# Patient Record
Sex: Female | Born: 1970
Health system: Southern US, Community
[De-identification: ages and names within clinical notes are randomized; demographics above are authoritative.]

## PROBLEM LIST (undated history)

## (undated) DIAGNOSIS — IMO0002 Reserved for concepts with insufficient information to code with codable children: Secondary | ICD-10-CM

## (undated) DIAGNOSIS — M069 Rheumatoid arthritis, unspecified: Secondary | ICD-10-CM

## (undated) DIAGNOSIS — S32019A Unspecified fracture of first lumbar vertebra, initial encounter for closed fracture: Secondary | ICD-10-CM

## (undated) DIAGNOSIS — M329 Systemic lupus erythematosus, unspecified: Secondary | ICD-10-CM

## (undated) DIAGNOSIS — M8580 Other specified disorders of bone density and structure, unspecified site: Secondary | ICD-10-CM

## (undated) DIAGNOSIS — R569 Unspecified convulsions: Secondary | ICD-10-CM

## (undated) HISTORY — DX: Other specified disorders of bone density and structure, unspecified site: M85.80

## (undated) HISTORY — DX: Unspecified convulsions: R56.9

## (undated) HISTORY — PX: SPINAL CORD STIMULATOR IMPLANT: SHX2422

## (undated) HISTORY — DX: Reserved for concepts with insufficient information to code with codable children: IMO0002

## (undated) HISTORY — DX: Systemic lupus erythematosus, unspecified: M32.9

## (undated) HISTORY — DX: Unspecified fracture of first lumbar vertebra, initial encounter for closed fracture: S32.019A

## (undated) HISTORY — DX: Rheumatoid arthritis, unspecified: M06.9

---

## 1998-06-29 ENCOUNTER — Ambulatory Visit (HOSPITAL_COMMUNITY): Admission: RE | Admit: 1998-06-29 | Discharge: 1998-06-29 | Payer: Self-pay | Admitting: Obstetrics and Gynecology

## 1998-10-04 ENCOUNTER — Other Ambulatory Visit: Admission: RE | Admit: 1998-10-04 | Discharge: 1998-10-04 | Payer: Self-pay | Admitting: Obstetrics and Gynecology

## 1998-12-13 ENCOUNTER — Inpatient Hospital Stay (HOSPITAL_COMMUNITY): Admission: EM | Admit: 1998-12-13 | Discharge: 1998-12-21 | Payer: Self-pay | Admitting: Neurosurgery

## 1998-12-13 ENCOUNTER — Encounter: Payer: Self-pay | Admitting: Emergency Medicine

## 1998-12-13 ENCOUNTER — Encounter: Payer: Self-pay | Admitting: Neurosurgery

## 1998-12-14 ENCOUNTER — Encounter: Payer: Self-pay | Admitting: Neurosurgery

## 1998-12-18 ENCOUNTER — Encounter: Payer: Self-pay | Admitting: Neurosurgery

## 1999-02-08 ENCOUNTER — Ambulatory Visit (HOSPITAL_COMMUNITY): Admission: RE | Admit: 1999-02-08 | Discharge: 1999-02-08 | Payer: Self-pay | Admitting: Neurosurgery

## 1999-02-08 ENCOUNTER — Encounter: Payer: Self-pay | Admitting: Neurosurgery

## 1999-04-10 ENCOUNTER — Encounter: Payer: Self-pay | Admitting: Neurosurgery

## 1999-04-10 ENCOUNTER — Ambulatory Visit (HOSPITAL_COMMUNITY): Admission: RE | Admit: 1999-04-10 | Discharge: 1999-04-10 | Payer: Self-pay | Admitting: Neurosurgery

## 1999-12-11 ENCOUNTER — Encounter: Payer: Self-pay | Admitting: Neurosurgery

## 1999-12-11 ENCOUNTER — Ambulatory Visit (HOSPITAL_COMMUNITY): Admission: RE | Admit: 1999-12-11 | Discharge: 1999-12-11 | Payer: Self-pay | Admitting: Neurosurgery

## 2000-12-09 ENCOUNTER — Other Ambulatory Visit: Admission: RE | Admit: 2000-12-09 | Discharge: 2000-12-09 | Payer: Self-pay | Admitting: Ophthalmology

## 2001-12-15 ENCOUNTER — Other Ambulatory Visit: Admission: RE | Admit: 2001-12-15 | Discharge: 2001-12-15 | Payer: Self-pay | Admitting: Obstetrics and Gynecology

## 2002-12-18 ENCOUNTER — Other Ambulatory Visit: Admission: RE | Admit: 2002-12-18 | Discharge: 2002-12-18 | Payer: Self-pay | Admitting: Obstetrics and Gynecology

## 2020-08-26 ENCOUNTER — Encounter: Payer: Self-pay | Admitting: Neurology

## 2020-12-02 ENCOUNTER — Ambulatory Visit: Payer: Self-pay | Admitting: Neurology

## 2021-01-09 ENCOUNTER — Encounter: Payer: Self-pay | Admitting: Neurology

## 2021-01-09 ENCOUNTER — Other Ambulatory Visit: Payer: Self-pay

## 2021-01-09 ENCOUNTER — Ambulatory Visit: Payer: Medicare HMO | Admitting: Neurology

## 2021-01-09 VITALS — BP 111/73 | HR 92 | Ht 68.5 in | Wt 139.2 lb

## 2021-01-09 DIAGNOSIS — R251 Tremor, unspecified: Secondary | ICD-10-CM | POA: Diagnosis not present

## 2021-01-09 DIAGNOSIS — G40909 Epilepsy, unspecified, not intractable, without status epilepticus: Secondary | ICD-10-CM

## 2021-01-09 DIAGNOSIS — R519 Headache, unspecified: Secondary | ICD-10-CM | POA: Diagnosis not present

## 2021-01-09 MED ORDER — CLONAZEPAM 0.5 MG PO TABS: ORAL_TABLET | ORAL | 5 refills | Status: DC

## 2021-01-09 MED ORDER — TOPIRAMATE 25 MG PO TABS: ORAL_TABLET | ORAL | 11 refills | Status: DC

## 2021-01-09 NOTE — Patient Instructions (Signed)
1. Schedule head CT without contrast  2. Schedule routine EEG  3. Refills sent for clonazepam 0.5mg  1 tab in AM, 2 tabs in PM and Topamax 25mg  1 tab in AM, 3 tabs in PM  4. Recommend finding a good therapist that you feel is a good fit for you  5. Follow-up in 6 months, call for any changes

## 2021-01-09 NOTE — Progress Notes (Signed)
NEUROLOGY CONSULTATION NOTE  Robin Paul MRN: [id number] DOB: [birth year]  Referring provider: Dr. Gildardo Cranker Primary care provider: Dr. Gildardo Cranker  Reason for consult:  Myoclonic seizures  Dear Dr Tenny Craw:  Thank you for your kind referral of Robin Paul for consultation of the above symptoms. Although her history is well known to you, please allow me to reiterate it for the purpose of our medical record. She is alone in the office today. Records and images were personally reviewed where available.   HISTORY OF PRESENT ILLNESS: This is a 50 year old left-handed woman with a history of Chiari malformation s/p posterior fossa decompression and VP shunt in 1991, history of myoclonic seizures s/p VNS/VNS removal in 2013, functional ataxia, chronic headaches, SCS placement, presenting to establish care. She has been seeing neurologist [name] in [place], [place] since 2015, records were reviewed. She was evaluated at [facility] for VP shunt malfunctioning and essentially felt to benefit from VP shunt removal (hypotensive headaches, VP shunt causing occipital neuralgia), with significant improvement of headaches with shunt removal. Headaches controlled on Topiramate 25/75 and prn Cambia. She has clonazepam as needed for muscle spasm/muscle pain. She takes 1-2 tablets of clonazepam to "keep the muscles at bay." She had her last refill in December and had been spreading it out and cutting in half, seeing a difference when she is unable to take the medication. She would go through a series of 4-5 spasms in a row, she can feel it gathering up in her chest, one arm, then the whole body. She has not had any falls due to these, the falls come from the right leg jerking that "no one has been able to figure out." She would stand and the left starts shaking. When she had her SCS done, her spine doctor told her it had nothing to do with her spine and wanted her to go to the [facility]. The clonazepam helps with  her right leg shaking as well. Last fall was a weeks ago, due to a combination of going up a step and right leg shaking. There is no loss of awareness/consciousness with the leg shaking. She reports a sensation like a goose egg in in the right frontal region. There are times when after getting up she would have pain localized at that spot. It was felt to be due to her neck or prior surgeries. Pain occurs every other week but comes on pretty strong, she has noticed when really bad she would have dried blood in her nostrils. She takes Aleve or Tylenol which may or may not help. She takes Dilaudid for her back but the head pain does not respond to this. She does not sleep well, with difficulties in sleep maintenance. She had been seeing a therapist in [facility] who also helped with her migraines. She reports myoclonic seizure where diagnosed in her mid to late 76s when they were occurring "all the time, up to 20 in a day." Her leg would jerk and she would kick the desk. At the same time, she was having headaches. She had an EEG and was diagnosed with myoclonic jerks at age 25. She was tried on different medications until she settled on Topamax in 1998 which has worked the best. She takes 25 in AM, 75mg  in PM and feels this dose is works best with her body metabolism. She went up to 200mg  in the apst which caused side effects, they were able to get the dose down when she had a  VNS, then VNS was taken out in 2013. She moved back to [place] at the end of June, her divorce was finalized last week. She lives alone.   Review of prior notes on Epic from her neurologist in 2015: "she was labeled with clonic seizures, I do not have her long-term EEG report available from a different facility (2008), but reportedly it was described as normal." She had an EEG in 2015 with occasional focal delta slowing over the left temporal region, additionally occasional bitemporal theta slowing during wake state. She was evaluated by a Movement Disorder  specialist in 2016 for tremors in both legs despite treatment with Baclofen, Gabapentin, Lyrica, Cymbalta, Topamax, Keppra, carbamazepine, valproic acid. It was noted the jerks occur along with vocal sounds. She has tremor in hands, legs and head occasionally, triggered by certain positions. 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 PT and exercise were recommended, as well as increase in clonazepam dose to 1 tab TID.  She brings a copy of her EMG/NCV of her legs done at [facility] in October 2019 showing bilateral, inactive, lumbosacral radiculopathies (L5-S1 on the right and S2 on the left). She had numbness in her legs, worse after sitting for prolonged periods. She had incontinence when the setting on her SCS were too high.   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 There is no history of febrile convulsions, CNS infections such as meningitis/encephalitis, significant traumatic brain injury, neurosurgical procedures, or family history of seizures.  Prior ASMs: Gabapentin, Lyrica, Cymbalta, Topamax, Keppra, carbamazepine, valproic acid.   PAST MEDICAL HISTORY: Past Medical History:  Diagnosis Date  . Arthritis, rheumatoid (HCC)   . L1 vertebral fracture (HCC)   . Lupus (HCC)   . Seizure (HCC)    1998    PAST SURGICAL HISTORY: Past Surgical History:  Procedure Laterality Date  . SPINAL CORD STIMULATOR IMPLANT      MEDICATIONS: No current outpatient medications on file prior to visit.   No current facility-administered medications on file prior to visit.    ALLERGIES: Not on File  FAMILY HISTORY: History reviewed. No pertinent family history.  SOCIAL HISTORY: Social History   Socioeconomic History  . Marital status: Divorced    Spouse name: Not on file  . Number of children: Not on file  . Years of education: Not on file  . Highest education level: Not on file  Occupational History  . Not on file  Tobacco  Use  . Smoking status: Never Smoker  . Smokeless tobacco: Never Used  Vaping Use  . Vaping Use: Never used  Substance and Sexual Activity  . Alcohol use: Never  . Drug use: Never  . Sexual activity: Not on file  Other Topics Concern  . Not on file  Social History Narrative   Left handed    Lives alone    Social Determinants of Health   Financial Resource Strain: Not on file  Food Insecurity: Not on file  Transportation Needs: Not on file  Physical Activity: Not on file  Stress: Not on file  Social Connections: Not on file  Intimate Partner Violence: Not on file     PHYSICAL EXAM: Vitals:   01/09/21 1255  BP: 111/73  Pulse: 92  SpO2: 97%   General: No acute distress Head:  Normocephalic/atraumatic Skin/Extremities: No rash, no edema Neurological Exam: Mental status: alert and awake. No dysarthria or aphasia, Fund of knowledge is appropriate.  Recent and remote memory are  intact.  Attention and concentration are normal.  Cranial nerves: CN I: not tested CN II: pupils equal, round and reactive to light, visual fields intact CN III, IV, VI:  full range of motion, no nystagmus, no ptosis CN V: facial sensation intact CN VII: upper and lower face symmetric CN VIII: hearing intact to conversation Bulk & Tone: normal, no fasciculations. Motor: 5/5 throughout with no pronator drift. Sensation: intact to light touch, cold, pin, vibration sense.  Deep Tendon Reflexes: +2 throughout Cerebellar: no incoordination on finger to nose testing Gait: she started having irregular right leg jerking when she lifted her leg and started walking around the room with continued jerking   IMPRESSION: This is a 50 year old left-handed woman with a history of Chiari malformation s/p posterior fossa decompression and VP shunt in 1991, history of myoclonic seizures s/p VNS/VNS removal in 2013, functional ataxia, chronic headaches, SCS placement, presenting to establish care. Extensive records  review done, including Movement Disorders note indicating psychogenic movement disorder. She is concerned about pain on the left side where she feels a "goose egg," head CT without contrast will be ordered. She will be scheduled for an EEG. She is satisfied with current medication doses, refills sent for clonazepam 0.5mg  1 tab in AM, 2 tabs in PM and Topamax 25mg  1 tab in AM, 3 tabs in PM. She became tearful in the office with life changes, discussed finding a therapist that would be a good fit for her. [place] driving laws indicate no driving after a seizure until 6 months seizure-free. Follow-up in 6 months, she knows to call for any changes.   Thank you for allowing me to participate in the care of this patient. Please do not hesitate to call for any questions or concerns.   , M.D.  CC: Dr. Patrcia Dolly

## 2021-01-16 ENCOUNTER — Other Ambulatory Visit: Payer: Self-pay

## 2021-01-16 ENCOUNTER — Ambulatory Visit: Payer: Medicare HMO | Admitting: Neurology

## 2021-01-16 DIAGNOSIS — G40909 Epilepsy, unspecified, not intractable, without status epilepticus: Secondary | ICD-10-CM | POA: Diagnosis not present

## 2021-01-16 DIAGNOSIS — R519 Headache, unspecified: Secondary | ICD-10-CM

## 2021-01-20 DIAGNOSIS — M5136 Other intervertebral disc degeneration, lumbar region: Secondary | ICD-10-CM | POA: Diagnosis not present

## 2021-01-20 DIAGNOSIS — Z6821 Body mass index (BMI) 21.0-21.9, adult: Secondary | ICD-10-CM | POA: Diagnosis not present

## 2021-01-20 DIAGNOSIS — M0609 Rheumatoid arthritis without rheumatoid factor, multiple sites: Secondary | ICD-10-CM | POA: Diagnosis not present

## 2021-01-20 DIAGNOSIS — R768 Other specified abnormal immunological findings in serum: Secondary | ICD-10-CM | POA: Diagnosis not present

## 2021-01-23 ENCOUNTER — Telehealth: Payer: Self-pay | Admitting: Neurology

## 2021-01-23 NOTE — Telephone Encounter (Signed)
Pt would like her results of the EEG  Please call

## 2021-01-25 ENCOUNTER — Other Ambulatory Visit: Payer: Medicare HMO

## 2021-01-27 NOTE — Telephone Encounter (Signed)
Pt called and informed that EEG was normal, no seizure activity on brain waves seen.

## 2021-01-27 NOTE — Telephone Encounter (Signed)
Pls let her know the EEG was normal, no seizure activity on brain waves seen. Thanks

## 2021-01-30 DIAGNOSIS — Z9689 Presence of other specified functional implants: Secondary | ICD-10-CM | POA: Diagnosis not present

## 2021-01-30 DIAGNOSIS — G894 Chronic pain syndrome: Secondary | ICD-10-CM | POA: Diagnosis not present

## 2021-01-30 DIAGNOSIS — M5416 Radiculopathy, lumbar region: Secondary | ICD-10-CM | POA: Diagnosis not present

## 2021-01-31 ENCOUNTER — Other Ambulatory Visit: Payer: Self-pay

## 2021-01-31 ENCOUNTER — Ambulatory Visit
Admission: RE | Admit: 2021-01-31 | Discharge: 2021-01-31 | Disposition: A | Discharge status: Home / Self Care | Payer: Medicare HMO | Source: Ambulatory Visit | Attending: Neurology | Admitting: Neurology

## 2021-01-31 DIAGNOSIS — G9389 Other specified disorders of brain: Secondary | ICD-10-CM | POA: Diagnosis not present

## 2021-01-31 DIAGNOSIS — R519 Headache, unspecified: Secondary | ICD-10-CM | POA: Diagnosis not present

## 2021-01-31 DIAGNOSIS — G40909 Epilepsy, unspecified, not intractable, without status epilepticus: Secondary | ICD-10-CM

## 2021-02-01 ENCOUNTER — Telehealth: Payer: Self-pay

## 2021-02-01 NOTE — Telephone Encounter (Signed)
Pt called and given CT results

## 2021-02-01 NOTE — Telephone Encounter (Signed)
-----   Message from Van Clines, MD sent at 02/01/2021  2:29 PM EDT ----- Pls let her know the head CT looked fine, no evidence of tumor, stroke, or bleed. Thanks

## 2021-02-03 NOTE — Procedures (Addendum)
ELECTROENCEPHALOGRAM REPORT  Date of Study: 01/16/2021  Patient's Name: Robin Paul MRN: [id number] Date of Birth: [birth year]  Referring Provider: Dr. Patrcia Dolly  Clinical History: This is a 50 year old woman with a diagnosis of myoclonic epilepsy, right leg shaking.  Medications: Topamax Clonazepam Prozac   Technical Summary: A multichannel digital 47-minute EEG recording measured by the international 10-20 system with electrodes applied with paste and impedances below 5000 ohms performed in our laboratory with EKG monitoring in an awake and asleep patient.  Hyperventilation was not performed. Photic stimulation was performed.  The digital EEG was referentially recorded, reformatted, and digitally filtered in a variety of bipolar and referential montages for optimal display.    Description: The patient is awake and asleep during the recording.  During maximal wakefulness, there is a symmetric, medium voltage 10 Hz posterior dominant rhythm that attenuates with eye opening.  The record is symmetric.  During drowsiness and sleep, there is an increase in theta slowing of the background.  Vertex waves and symmetric sleep spindles were seen.  Photic stimulation did not elicit any abnormalities. She has an episode that starts with slight pelvic thrusting followed by low amplitude high frequency shaking of the right leg that stops suddenly after 36 seconds, then she flexes and extends foot. No associated epileptiform correlate.  There were no epileptiform discharges or electrographic seizures seen.    EKG lead was unremarkable.  Impression: This 47-minute awake and asleep EEG is normal.  Episode of right leg shaking did not show EEG correlate.  Clinical Correlation: A normal EEG does not exclude a clinical diagnosis of epilepsy. Episode of right leg shaking was non-epileptic. If further clinical questions remain, prolonged EEG may be helpful.  Clinical correlation is advised.   Patrcia Dolly, M.D.

## 2021-02-09 ENCOUNTER — Other Ambulatory Visit: Payer: Self-pay

## 2021-02-09 DIAGNOSIS — M0609 Rheumatoid arthritis without rheumatoid factor, multiple sites: Secondary | ICD-10-CM | POA: Diagnosis not present

## 2021-02-13 ENCOUNTER — Encounter: Payer: Self-pay | Admitting: Neurology

## 2021-02-13 NOTE — Progress Notes (Signed)
Samara Deist Vanorman Key: [id number] - PA Case ID: [id number] Need help? Call us at [phone] Outcome Approvedon March 26 PA Case: [id number], Status: Approved, Coverage Starts on: 11/19/2020 12:00:00 AM, Coverage Ends on: 11/18/2021 12:00:00 AM. Questions? Contact [phone]. Drug Topiramate 25MG  tablets Form Electronic PA Form

## 2021-04-06 DIAGNOSIS — Z79899 Other long term (current) drug therapy: Secondary | ICD-10-CM | POA: Diagnosis not present

## 2021-04-06 DIAGNOSIS — Z111 Encounter for screening for respiratory tuberculosis: Secondary | ICD-10-CM | POA: Diagnosis not present

## 2021-04-06 DIAGNOSIS — M0609 Rheumatoid arthritis without rheumatoid factor, multiple sites: Secondary | ICD-10-CM | POA: Diagnosis not present

## 2021-04-06 DIAGNOSIS — R5383 Other fatigue: Secondary | ICD-10-CM | POA: Diagnosis not present

## 2021-04-26 DIAGNOSIS — Z6821 Body mass index (BMI) 21.0-21.9, adult: Secondary | ICD-10-CM | POA: Diagnosis not present

## 2021-04-26 DIAGNOSIS — M25512 Pain in left shoulder: Secondary | ICD-10-CM | POA: Diagnosis not present

## 2021-04-26 DIAGNOSIS — M0609 Rheumatoid arthritis without rheumatoid factor, multiple sites: Secondary | ICD-10-CM | POA: Diagnosis not present

## 2021-04-26 DIAGNOSIS — M5136 Other intervertebral disc degeneration, lumbar region: Secondary | ICD-10-CM | POA: Diagnosis not present

## 2021-04-26 DIAGNOSIS — R768 Other specified abnormal immunological findings in serum: Secondary | ICD-10-CM | POA: Diagnosis not present

## 2021-05-01 DIAGNOSIS — G894 Chronic pain syndrome: Secondary | ICD-10-CM | POA: Diagnosis not present

## 2021-05-01 DIAGNOSIS — G259 Extrapyramidal and movement disorder, unspecified: Secondary | ICD-10-CM | POA: Diagnosis not present

## 2021-05-01 DIAGNOSIS — Z9689 Presence of other specified functional implants: Secondary | ICD-10-CM | POA: Diagnosis not present

## 2021-05-01 DIAGNOSIS — M5416 Radiculopathy, lumbar region: Secondary | ICD-10-CM | POA: Diagnosis not present

## 2021-06-01 DIAGNOSIS — M3500 Sicca syndrome, unspecified: Secondary | ICD-10-CM | POA: Diagnosis not present

## 2021-06-01 DIAGNOSIS — G40909 Epilepsy, unspecified, not intractable, without status epilepticus: Secondary | ICD-10-CM | POA: Diagnosis not present

## 2021-06-01 DIAGNOSIS — E041 Nontoxic single thyroid nodule: Secondary | ICD-10-CM | POA: Diagnosis not present

## 2021-06-01 DIAGNOSIS — M329 Systemic lupus erythematosus, unspecified: Secondary | ICD-10-CM | POA: Diagnosis not present

## 2021-06-01 DIAGNOSIS — M0609 Rheumatoid arthritis without rheumatoid factor, multiple sites: Secondary | ICD-10-CM | POA: Diagnosis not present

## 2021-06-01 DIAGNOSIS — L732 Hidradenitis suppurativa: Secondary | ICD-10-CM | POA: Diagnosis not present

## 2021-06-01 DIAGNOSIS — Z0000 Encounter for general adult medical examination without abnormal findings: Secondary | ICD-10-CM | POA: Diagnosis not present

## 2021-06-01 DIAGNOSIS — Z136 Encounter for screening for cardiovascular disorders: Secondary | ICD-10-CM | POA: Diagnosis not present

## 2021-06-01 DIAGNOSIS — M069 Rheumatoid arthritis, unspecified: Secondary | ICD-10-CM | POA: Diagnosis not present

## 2021-07-26 ENCOUNTER — Ambulatory Visit: Payer: Medicare HMO | Admitting: Neurology

## 2021-07-26 ENCOUNTER — Other Ambulatory Visit: Payer: Self-pay

## 2021-07-26 ENCOUNTER — Encounter: Payer: Self-pay | Admitting: Neurology

## 2021-07-26 VITALS — BP 107/66 | HR 63 | Ht 69.0 in | Wt 145.8 lb

## 2021-07-26 DIAGNOSIS — R251 Tremor, unspecified: Secondary | ICD-10-CM | POA: Diagnosis not present

## 2021-07-26 DIAGNOSIS — R519 Headache, unspecified: Secondary | ICD-10-CM

## 2021-07-26 DIAGNOSIS — G40909 Epilepsy, unspecified, not intractable, without status epilepticus: Secondary | ICD-10-CM

## 2021-07-26 MED ORDER — CLONAZEPAM 0.5 MG PO TABS: ORAL_TABLET | ORAL | 5 refills | Status: DC

## 2021-07-26 MED ORDER — TOPIRAMATE 25 MG PO TABS: ORAL_TABLET | ORAL | 3 refills | Status: DC

## 2021-07-26 NOTE — Patient Instructions (Signed)
Good to see you! Refills have been sent for your medications. Follow-up in 6-8 months, call for any changes. 

## 2021-07-26 NOTE — Progress Notes (Signed)
NEUROLOGY FOLLOW UP OFFICE NOTE  Robin Paul [id number] 1970-12-24  HISTORY OF PRESENT ILLNESS: I had the pleasure of seeing Robin Paul in follow-up in the neurology clinic on 07/26/2021.  The patient was last seen 6 months ago for seizures. She has a history of Chiari malformation s/p posterior decompression and VP shunt, myoclonic seizures s/p VNS/VNS removal in 2013, functional ataxia, and chronic headaches. She has been evaluated by a Movement Disorders specialist in the past with a diagnosis of psychogenic movement disorder. On her initial visit, she reported headaches were controlled on Topiramate 25/75mg  and prn Cambia. She was on clonazepam 0.5mg  1 tab in AM, 2 tabs in PM for muscle spasms/jerks. She continued to report right leg shaking/jerking. EEG done 12/2020 was normal, episode of right leg jerking did not show any EEG correlate. She had a head CT without contrast in 01/2021 which did not show any acute changes, suboccipital craniectomy with mild underlying cerebellar encephalomalacia.   Since her last visit, symptoms overall stable. She has backed off a little on the clonazepam because she could not concentrate when taking it with the hydromorphone. She would take clonazepam 1/2 tab in AM, 1 tab in PM, but during times where she has more jerking and headaches that were consistent, she would go back to taking 1 tab in AM, 2 tabs in PM for a week until symptoms settle down. She continues on Topiramate  1 tab in AM, 3 tabs in PM. In the past,  dose was too much. She manages her medications depending on what her body tells her. She continues to deal with a lot of back pain and sees Neurosurgery for spinal cord stimulator adjustment. She had 2 falls since her last visit, both going down steps, last fall was in May, it took a couple of months to heal. She lives alone. Her stepmother passed away in May 29, 2023 and her father is in the hospital. Her mother lives close by.   History on Initial  Assessment 01/09/2021: This is a 50 year old left-handed woman with a history of Chiari malformation s/p posterior fossa decompression and VP shunt in 1991, history of myoclonic seizures s/p VNS/VNS removal in 2013, functional ataxia, chronic headaches, SCS placement, presenting to establish care. She has been seeing neurologist [name] in [place], [place] since 2015, records were reviewed. She was evaluated at [facility] for VP shunt malfunctioning and essentially felt to benefit from VP shunt removal (hypotensive headaches, VP shunt causing occipital neuralgia), with significant improvement of headaches with shunt removal. Headaches controlled on Topiramate 25/75 and prn Cambia. She has clonazepam as needed for muscle spasm/muscle pain. She takes 1-2 tablets of clonazepam to "keep the muscles at bay." She had her last refill in December and had been spreading it out and cutting in half, seeing a difference when she is unable to take the medication. She would go through a series of 4-5 spasms in a row, she can feel it gathering up in her chest, one arm, then the whole body. She has not had any falls due to these, the falls come from the right leg jerking that "no one has been able to figure out." She would stand and the left starts shaking. When she had her SCS done, her spine doctor told her it had nothing to do with her spine and wanted her to go to the [facility]. The clonazepam helps with her right leg shaking as well. Last fall was a weeks ago, due to a combination of  going up a step and right leg shaking. There is no loss of awareness/consciousness with the leg shaking. She reports a sensation like a goose egg in in the right frontal region. There are times when after getting up she would have pain localized at that spot. It was felt to be due to her neck or prior surgeries. Pain occurs every other week but comes on pretty strong, she has noticed when really bad she would have dried blood in her nostrils. She  takes Aleve or Tylenol which may or may not help. She takes Dilaudid for her back but the head pain does not respond to this. She does not sleep well, with difficulties in sleep maintenance. She had been seeing a therapist in [facility] who also helped with her migraines. She reports myoclonic seizure where diagnosed in her mid to late 31s when they were occurring "all the time, up to 20 in a day." Her leg would jerk and she would kick the desk. At the same time, she was having headaches. She had an EEG and was diagnosed with myoclonic jerks at age 50. She was tried on different medications until she settled on Topamax in 1998 which has worked the best. She takes 25 in AM,  in PM and feels this dose is works best with her body metabolism. She went up to  in the apst which caused side effects, they were able to get the dose down when she had a VNS, then VNS was taken out in 2013. She moved back to [place] at the end of June, her divorce was finalized last week. She lives alone.   Review of prior notes on Epic from her neurologist in 2015: "she was labeled with clonic seizures, I do not have her long-term EEG report available from a different facility (2008), but reportedly it was described as normal." She had an EEG in 2015 with occasional focal delta slowing over the left temporal region, additionally occasional bitemporal theta slowing during wake state. She was evaluated by a Movement Disorder specialist in 2016 for tremors in both legs despite treatment with Baclofen, Gabapentin, Lyrica, Cymbalta, Topamax, Keppra, carbamazepine, valproic acid. It was noted the jerks occur along with vocal sounds. She has tremor in hands, legs and head occasionally, triggered by certain positions. 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 PT and exercise were recommended, as well as increase in clonazepam dose to 1 tab TID.  She brings a copy of her EMG/NCV of her legs done at [facility] in October 2019 showing  bilateral, inactive, lumbosacral radiculopathies (L5-S1 on the right and S2 on the left). She had numbness in her legs, worse after sitting for prolonged periods. She had incontinence when the setting on her SCS were too high.   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 There is no history of febrile convulsions, CNS infections such as meningitis/encephalitis, significant traumatic brain injury, neurosurgical procedures, or family history of seizures.  Prior ASMs: Gabapentin, Lyrica, Cymbalta, Topamax, Keppra, carbamazepine, valproic acid.   PAST MEDICAL HISTORY: Past Medical History:  Diagnosis Date   Arthritis, rheumatoid (HCC)    L1 vertebral fracture (HCC)    Lupus (HCC)    Seizure (HCC)    1998    MEDICATIONS: Current Outpatient Medications on File Prior to Visit  Medication Sig Dispense Refill   ascorbic acid (VITAMIN C) 1000 MG tablet      b complex vitamins capsule Take by mouth.  Cholecalciferol 125 MCG (5000 UT) TABS 10,000 UT daily     clonazePAM (KLONOPIN) 0.5 MG tablet Take 1 tab in the morning 2 tabs at night 90 tablet 5   Collagen-Boron-Hyaluronic Acid (MOVE FREE ULTRA JOINT HEALTH) 40-5-3.3 MG TABS See admin instructions.     FLUoxetine (PROZAC) 20 MG capsule 1 capsule     folic acid (FOLVITE) 1 MG tablet Take 3 mg by mouth. 3 mg daily     golimumab (SIMPONI ARIA) 50 MG/4ML SOLN injection Strength: 50 mg/4 mL; Form: solution; SIG: take 0 intravenously every 8 weeks     HYDROmorphone HCl (EXALGO) 8 MG TB24 take 1 tablet by oral route  every day at the same time each day swallowing whole. Do not break, crush, dissolve and/or chew. DNF 01/08/21     magnesium gluconate (MAGONATE) 500 MG tablet Take by mouth.     methocarbamol (ROBAXIN) 750 MG tablet Take by mouth.     Methotrexate, PF, 30 MG/0.6ML SOAJ Inject into the skin.     methylPREDNISolone (MEDROL DOSEPAK) 4 MG TBPK tablet See admin instructions. PRN for flair ups      Multiple Vitamins-Minerals (THERA-M) TABS Take 1 tablet by mouth daily.     topiramate (TOPAMAX) 25 MG tablet Take 1 tablet in the morning, and 3 tablets at night at bedtime 120 tablet 11   vitamin E 180 MG (400 UNITS) capsule 1 capsule     No current facility-administered medications on file prior to visit.    ALLERGIES: Allergies  Allergen Reactions   Codeine Other (See Comments) and Itching   Erythromycin Nausea And Vomiting and Nausea Only   Hydrocodone    Fentanyl Rash    FAMILY HISTORY: No family history on file.  SOCIAL HISTORY: Social History   Socioeconomic History   Marital status: Divorced    Spouse name: Not on file   Number of children: Not on file   Years of education: Not on file   Highest education level: Not on file  Occupational History   Not on file  Tobacco Use   Smoking status: Never   Smokeless tobacco: Never  Vaping Use   Vaping Use: Never used  Substance and Sexual Activity   Alcohol use: Never   Drug use: Never   Sexual activity: Not on file  Other Topics Concern   Not on file  Social History Narrative   ** Merged History Encounter **       Left handed  Lives alone    Social Determinants of Health   Financial Resource Strain: Not on file  Food Insecurity: Not on file  Transportation Needs: Not on file  Physical Activity: Not on file  Stress: Not on file  Social Connections: Not on file  Intimate Partner Violence: Not on file     PHYSICAL EXAM: Vitals:   07/26/21 1544  BP: 107/66  Pulse: 63  SpO2: 99%   General: No acute distress Head:  Normocephalic/atraumatic Skin/Extremities: No rash, no edema Neurological Exam: alert and awake. No aphasia or dysarthria. Fund of knowledge is appropriate.  Recent and remote memory are intact.  Attention and concentration are normal.   Cranial nerves: Pupils equal, round. Extraocular movements intact with no nystagmus. Visual fields full.  No facial asymmetry.  Motor: Bulk and tone  normal, muscle strength 5/5 throughout with no pronator drift.   Finger to nose testing intact.  Gait slow and cautious, no ataxia. No leg jerking today.   IMPRESSION: This is a 50 yo  LH woman with a history of Chiari malformation s/p posterior fossa decompression and VP shunt in 1991, history of myoclonic seizures s/p VNS/VNS removal in 2013, functional ataxia, chronic headaches, chronic back pain s/p SCS placement. We discussed tests done since her last visit, head CT no acute changes, EEG normal with right leg jerking showing normal EEG, indicating this is not epileptic. She was seen in the past by a Movement Disorders specialist and diagnosed with psychogenic movement disorder. Functional jerks and tremors occur in patients with chronic pain syndromes such as back pain. She is satisfied with current regimen of Topiramate 25mg  in AM 75mg  in PM for headache prophylaxis, clonazepam 0.5mg  1 tab in AM, 2 tabs in PM for body jerks, refills sent. Follow-up in 6-8 months, call for any changes.    Thank you for allowing me to participate in her care.  Please do not hesitate to call for any questions or concerns.    , M.D.   CC: Dr. 

## 2021-07-27 DIAGNOSIS — M0609 Rheumatoid arthritis without rheumatoid factor, multiple sites: Secondary | ICD-10-CM | POA: Diagnosis not present

## 2021-08-02 DIAGNOSIS — M5416 Radiculopathy, lumbar region: Secondary | ICD-10-CM | POA: Diagnosis not present

## 2021-08-02 DIAGNOSIS — G894 Chronic pain syndrome: Secondary | ICD-10-CM | POA: Diagnosis not present

## 2021-08-02 DIAGNOSIS — Z9689 Presence of other specified functional implants: Secondary | ICD-10-CM | POA: Diagnosis not present

## 2021-09-11 DIAGNOSIS — W19XXXA Unspecified fall, initial encounter: Secondary | ICD-10-CM | POA: Diagnosis not present

## 2021-09-11 DIAGNOSIS — S59902A Unspecified injury of left elbow, initial encounter: Secondary | ICD-10-CM | POA: Diagnosis not present

## 2021-09-11 DIAGNOSIS — S6992XA Unspecified injury of left wrist, hand and finger(s), initial encounter: Secondary | ICD-10-CM | POA: Diagnosis not present

## 2021-09-11 DIAGNOSIS — S52125A Nondisplaced fracture of head of left radius, initial encounter for closed fracture: Secondary | ICD-10-CM | POA: Diagnosis not present

## 2021-09-11 DIAGNOSIS — S4992XA Unspecified injury of left shoulder and upper arm, initial encounter: Secondary | ICD-10-CM | POA: Diagnosis not present

## 2021-09-11 DIAGNOSIS — S6991XA Unspecified injury of right wrist, hand and finger(s), initial encounter: Secondary | ICD-10-CM | POA: Diagnosis not present

## 2021-09-12 DIAGNOSIS — M25622 Stiffness of left elbow, not elsewhere classified: Secondary | ICD-10-CM | POA: Diagnosis not present

## 2021-09-12 DIAGNOSIS — M25532 Pain in left wrist: Secondary | ICD-10-CM | POA: Diagnosis not present

## 2021-09-12 DIAGNOSIS — S52124A Nondisplaced fracture of head of right radius, initial encounter for closed fracture: Secondary | ICD-10-CM | POA: Diagnosis not present

## 2021-09-21 DIAGNOSIS — M0609 Rheumatoid arthritis without rheumatoid factor, multiple sites: Secondary | ICD-10-CM | POA: Diagnosis not present

## 2021-10-03 DIAGNOSIS — S52135D Nondisplaced fracture of neck of left radius, subsequent encounter for closed fracture with routine healing: Secondary | ICD-10-CM | POA: Diagnosis not present

## 2021-10-17 DIAGNOSIS — M25622 Stiffness of left elbow, not elsewhere classified: Secondary | ICD-10-CM | POA: Diagnosis not present

## 2021-10-31 DIAGNOSIS — S52135D Nondisplaced fracture of neck of left radius, subsequent encounter for closed fracture with routine healing: Secondary | ICD-10-CM | POA: Diagnosis not present

## 2021-11-01 DIAGNOSIS — G894 Chronic pain syndrome: Secondary | ICD-10-CM | POA: Diagnosis not present

## 2021-11-01 DIAGNOSIS — Z9689 Presence of other specified functional implants: Secondary | ICD-10-CM | POA: Diagnosis not present

## 2021-11-01 DIAGNOSIS — F1120 Opioid dependence, uncomplicated: Secondary | ICD-10-CM | POA: Diagnosis not present

## 2021-11-01 DIAGNOSIS — M5416 Radiculopathy, lumbar region: Secondary | ICD-10-CM | POA: Diagnosis not present

## 2021-11-15 DIAGNOSIS — R059 Cough, unspecified: Secondary | ICD-10-CM | POA: Diagnosis not present

## 2021-11-15 DIAGNOSIS — R051 Acute cough: Secondary | ICD-10-CM | POA: Diagnosis not present

## 2021-11-15 DIAGNOSIS — R197 Diarrhea, unspecified: Secondary | ICD-10-CM | POA: Diagnosis not present

## 2021-11-15 DIAGNOSIS — U071 COVID-19: Secondary | ICD-10-CM | POA: Diagnosis not present

## 2021-11-15 DIAGNOSIS — Z03818 Encounter for observation for suspected exposure to other biological agents ruled out: Secondary | ICD-10-CM | POA: Diagnosis not present

## 2021-11-23 DIAGNOSIS — M0609 Rheumatoid arthritis without rheumatoid factor, multiple sites: Secondary | ICD-10-CM | POA: Diagnosis not present

## 2021-12-01 DIAGNOSIS — S52135D Nondisplaced fracture of neck of left radius, subsequent encounter for closed fracture with routine healing: Secondary | ICD-10-CM | POA: Diagnosis not present

## 2021-12-14 ENCOUNTER — Other Ambulatory Visit: Payer: Self-pay | Admitting: Family Medicine

## 2021-12-14 DIAGNOSIS — E041 Nontoxic single thyroid nodule: Secondary | ICD-10-CM | POA: Diagnosis not present

## 2021-12-14 DIAGNOSIS — M25552 Pain in left hip: Secondary | ICD-10-CM | POA: Diagnosis not present

## 2021-12-14 DIAGNOSIS — S42409A Unspecified fracture of lower end of unspecified humerus, initial encounter for closed fracture: Secondary | ICD-10-CM | POA: Diagnosis not present

## 2021-12-14 DIAGNOSIS — Z780 Asymptomatic menopausal state: Secondary | ICD-10-CM | POA: Diagnosis not present

## 2021-12-20 ENCOUNTER — Other Ambulatory Visit: Payer: Self-pay | Admitting: Family Medicine

## 2021-12-20 DIAGNOSIS — M8588 Other specified disorders of bone density and structure, other site: Secondary | ICD-10-CM

## 2021-12-25 ENCOUNTER — Other Ambulatory Visit: Payer: Self-pay | Admitting: Family Medicine

## 2021-12-25 ENCOUNTER — Ambulatory Visit
Admission: RE | Admit: 2021-12-25 | Discharge: 2021-12-25 | Disposition: A | Discharge status: Home / Self Care | Payer: Medicare HMO | Source: Ambulatory Visit | Attending: Family Medicine | Admitting: Family Medicine

## 2021-12-25 DIAGNOSIS — M25552 Pain in left hip: Secondary | ICD-10-CM

## 2021-12-25 DIAGNOSIS — E041 Nontoxic single thyroid nodule: Secondary | ICD-10-CM | POA: Diagnosis not present

## 2021-12-25 IMAGING — CR DG HIP (WITH OR WITHOUT PELVIS) 2-3V*L*
2 series · 2 of 2 positions shown · non-contrast
Comparison: None.

CLINICAL DATA: Patient fell 4 months ago with persistent left hip
pain.

EXAM:
DG HIP (WITH OR WITHOUT PELVIS) 2-3V LEFT

[w pelvis * (1 of 2)]
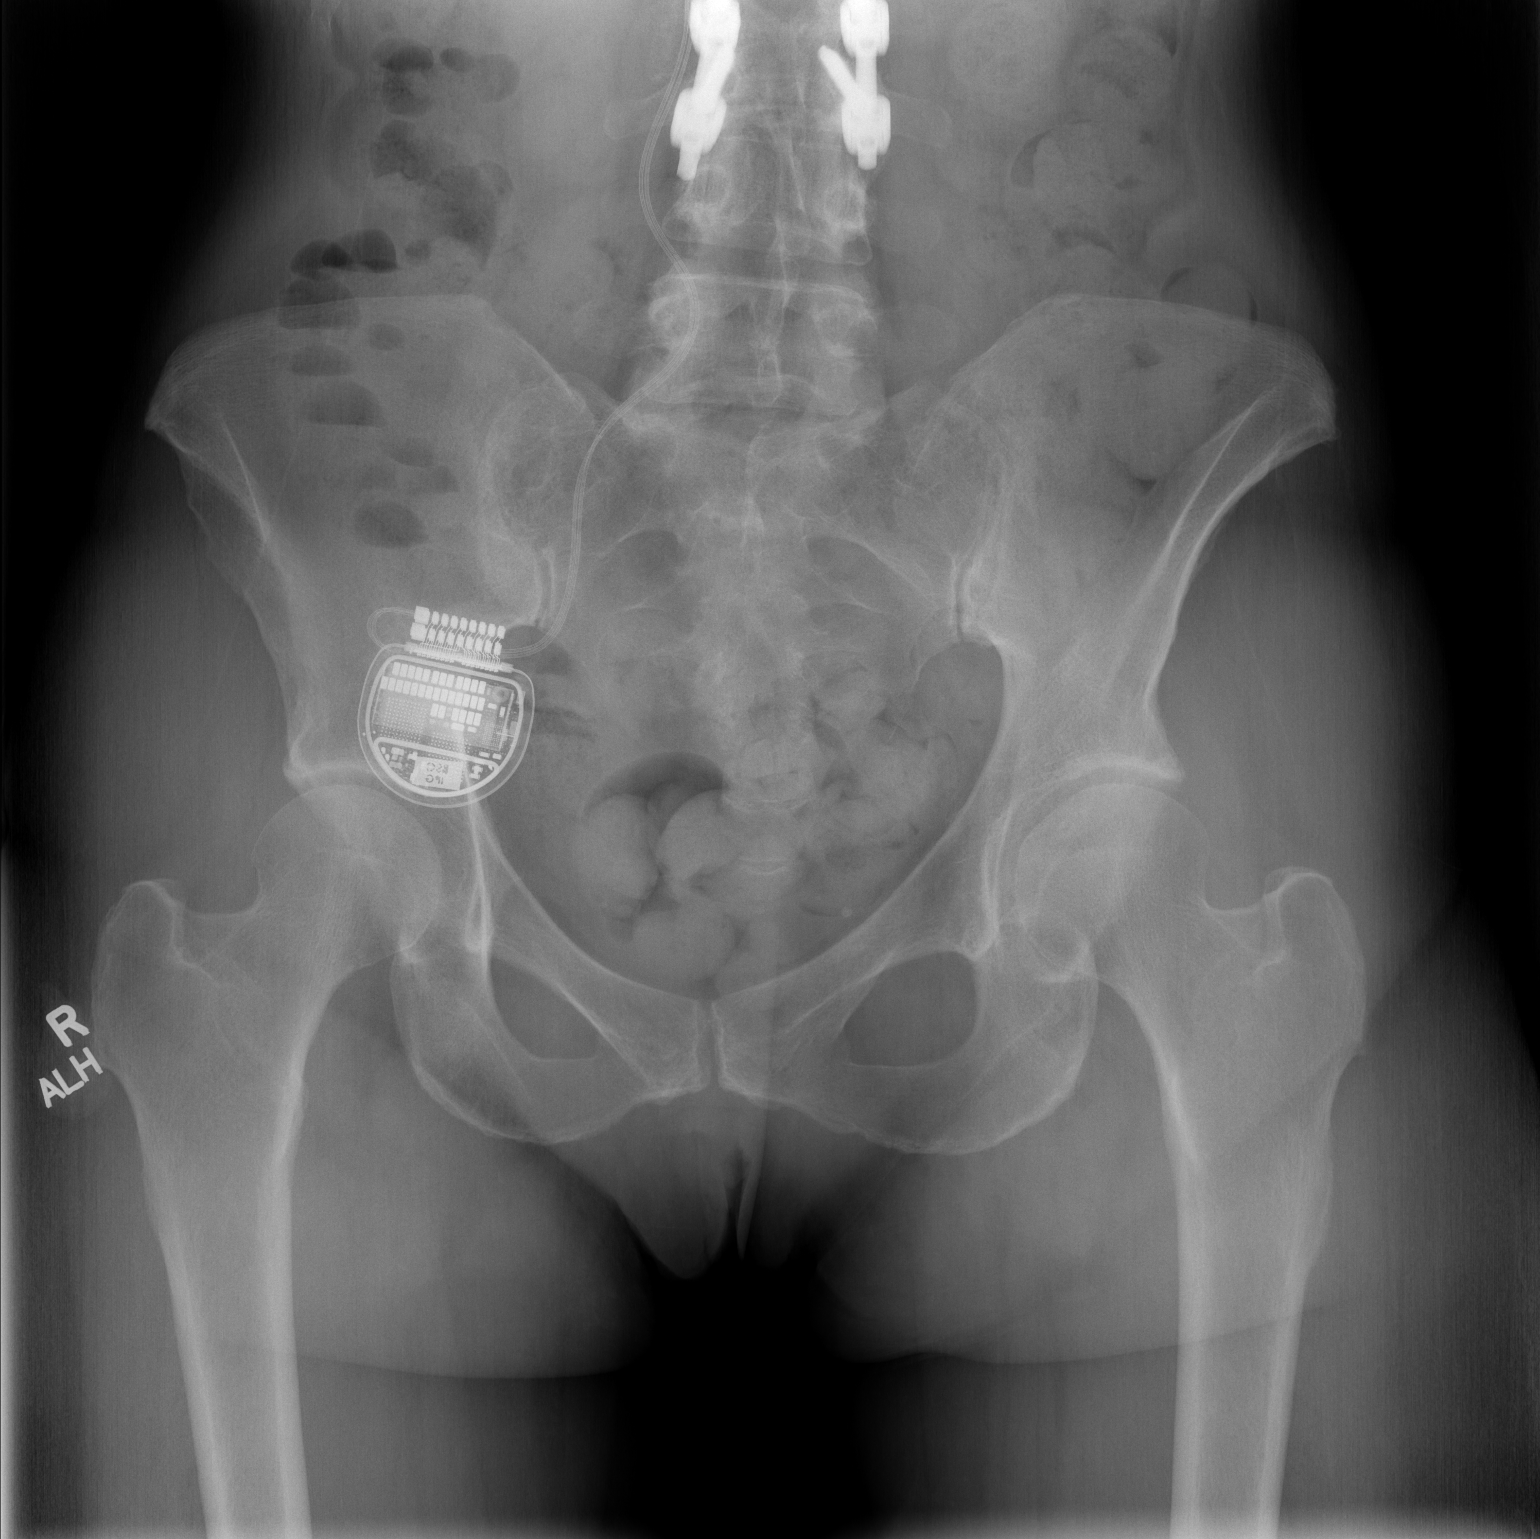

[w pelvis * (2 of 2)]
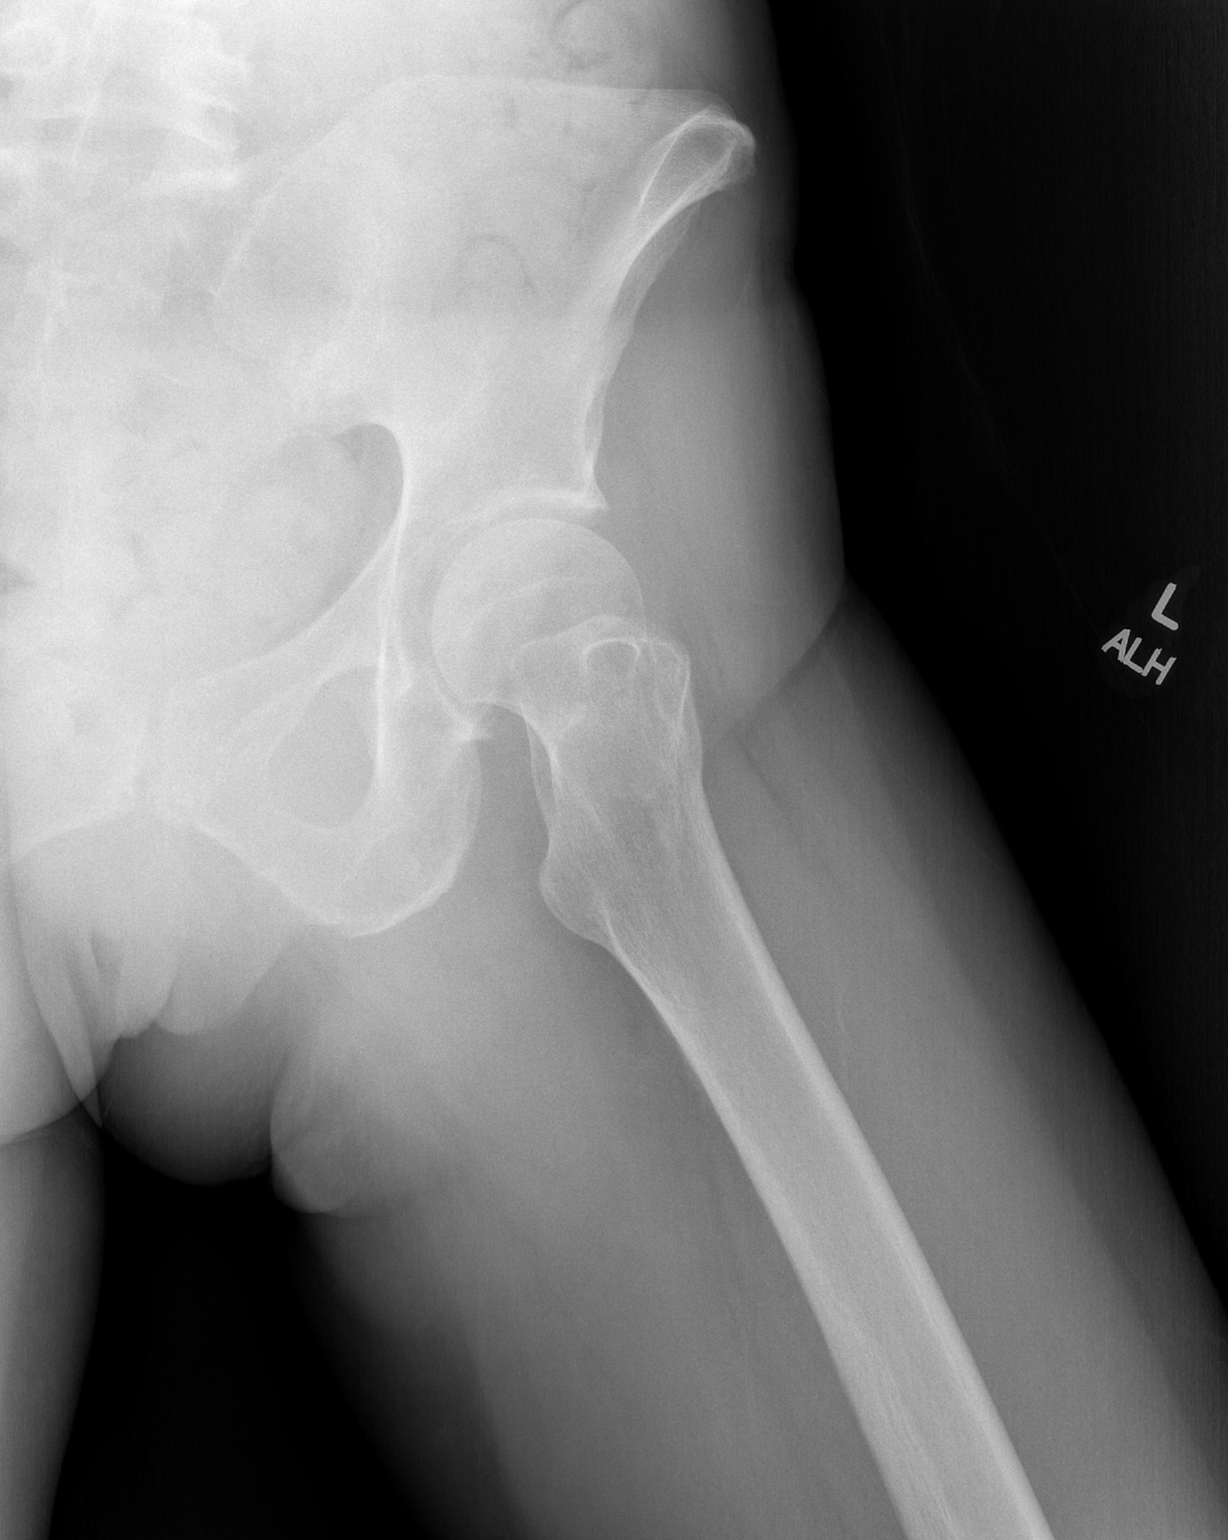

[2 of 2 positions shown; findings below may reference images not displayed]

FINDINGS: No fracture or dislocation. Suspected mild degenerative change the
left hip with joint space loss, subchondral sclerosis and
osteophytosis. No evidence of avascular necrosis. Limited
visualization of the pelvis demonstrates a spinal stimulator pack
overlying the right ilium with tip excluded from views. Similar mild
degenerative changes suspected within the contralateral right hip,
incompletely evaluated. Postoperative change of the mid lumbar
spine, incompletely evaluated. Moderate colonic stool burden.
Regional soft tissues appear normal.
IMPRESSION: Mild degenerative change of the left hip.

## 2021-12-28 ENCOUNTER — Ambulatory Visit
Admission: RE | Admit: 2021-12-28 | Discharge: 2021-12-28 | Disposition: A | Discharge status: Home / Self Care | Payer: Medicare HMO | Source: Ambulatory Visit | Attending: Family Medicine | Admitting: Family Medicine

## 2021-12-28 ENCOUNTER — Other Ambulatory Visit: Payer: Self-pay

## 2021-12-28 DIAGNOSIS — M8588 Other specified disorders of bone density and structure, other site: Secondary | ICD-10-CM

## 2021-12-28 DIAGNOSIS — M85852 Other specified disorders of bone density and structure, left thigh: Secondary | ICD-10-CM | POA: Diagnosis not present

## 2021-12-28 DIAGNOSIS — Z780 Asymptomatic menopausal state: Secondary | ICD-10-CM | POA: Diagnosis not present

## 2022-01-17 DIAGNOSIS — G894 Chronic pain syndrome: Secondary | ICD-10-CM | POA: Diagnosis not present

## 2022-01-17 DIAGNOSIS — Z9689 Presence of other specified functional implants: Secondary | ICD-10-CM | POA: Diagnosis not present

## 2022-01-17 DIAGNOSIS — M5416 Radiculopathy, lumbar region: Secondary | ICD-10-CM | POA: Diagnosis not present

## 2022-01-17 DIAGNOSIS — F1120 Opioid dependence, uncomplicated: Secondary | ICD-10-CM | POA: Diagnosis not present

## 2022-01-18 DIAGNOSIS — M0609 Rheumatoid arthritis without rheumatoid factor, multiple sites: Secondary | ICD-10-CM | POA: Diagnosis not present

## 2022-01-24 ENCOUNTER — Ambulatory Visit: Payer: Medicare HMO | Admitting: Neurology

## 2022-01-24 ENCOUNTER — Encounter: Payer: Self-pay | Admitting: Neurology

## 2022-01-24 ENCOUNTER — Other Ambulatory Visit: Payer: Self-pay

## 2022-01-24 VITALS — BP 90/52 | HR 77 | Ht 69.0 in | Wt 138.6 lb

## 2022-01-24 DIAGNOSIS — R251 Tremor, unspecified: Secondary | ICD-10-CM

## 2022-01-24 DIAGNOSIS — R519 Headache, unspecified: Secondary | ICD-10-CM | POA: Diagnosis not present

## 2022-01-24 DIAGNOSIS — M79604 Pain in right leg: Secondary | ICD-10-CM | POA: Diagnosis not present

## 2022-01-24 DIAGNOSIS — G40909 Epilepsy, unspecified, not intractable, without status epilepticus: Secondary | ICD-10-CM | POA: Diagnosis not present

## 2022-01-24 MED ORDER — TOPIRAMATE 25 MG PO TABS: ORAL_TABLET | ORAL | 3 refills | Status: DC

## 2022-01-24 MED ORDER — CLONAZEPAM 0.5 MG PO TABS: ORAL_TABLET | ORAL | 5 refills | Status: DC

## 2022-01-24 NOTE — Patient Instructions (Signed)
Good to see you. ? ?Continue all your medications ? ?2. Referral will be sent to work with the right hip/leg pain ? ?3. Follow-up in 6 months, call for any changes ?

## 2022-01-24 NOTE — Progress Notes (Signed)
NEUROLOGY FOLLOW UP OFFICE NOTE  Yousra Ivens [id number] [birth year]  HISTORY OF PRESENT ILLNESS: I had the pleasure of seeing Ahna Scouten in follow-up in the neurology clinic on 01/24/2022.  The patient was last seen 6 months ago for seizures. She has a history of Chiari malformation s/p posterior decompression and VP shunt, myoclonic seizures s/p VNS/VNS removal in 2013, functional ataxia, and chronic headaches. She has been evaluated by a Movement Disorders specialist in the past with a diagnosis of psychogenic movement disorder. Her headaches have been controlled on Topiramate 25/75mg  and prn Cambia. She was on clonazepam 0.5mg  1 tab in AM, 2 tabs in PM for muscle spasms/jerks. She continued to report right leg shaking/jerking. EEG done 12/2020 was normal, episode of right leg jerking did not show any EEG correlate. She had a head CT without contrast in 01/2021 which did not show any acute changes, suboccipital craniectomy with mild underlying cerebellar encephalomalacia.   She states that overall she seems to be doing well. Since her last visit, she reports that as long as she is stress-free, the headaches are good. She has been having more stress recently, she had to sell her home to move in and help her father, but now he is getting better and wanting her to find her own place, she is looking into moving into an apartment in [place]. 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 She takes clonazepam 0.5mg  1/2 tab BID, but at times would need more when she has more jerking. The increase dose knocks it out and helps her sleep better as well. Sleep has been interrupted by her increase in hip pain, with pain going down the back of her right leg to her knee and then foot. No bowel/bladder dysfunction, she is taking probiotics.    History on Initial Assessment 01/09/2021: This is a 51 year old  left-handed woman with a history of Chiari malformation s/p posterior fossa decompression and VP shunt in 1991, history of myoclonic seizures s/p VNS/VNS removal in 2013, functional ataxia, chronic headaches, SCS placement, presenting to establish care. She has been seeing neurologist [name] in [place], [place] since 2015, records were reviewed. She was evaluated at [facility] for VP shunt malfunctioning and essentially felt to benefit from VP shunt removal (hypotensive headaches, VP shunt causing occipital neuralgia), with significant improvement of headaches with shunt removal. Headaches controlled on Topiramate 25/75 and prn Cambia. She has clonazepam as needed for muscle spasm/muscle pain. She takes 1-2 tablets of clonazepam to "keep the muscles at bay." She had her last refill in December and had been spreading it out and cutting in half, seeing a difference when she is unable to take the medication. She would go through a series of 4-5 spasms in a row, she can feel it gathering up in her chest, one arm, then the whole body. She has not had any falls due to these, the falls come from the right leg jerking that "no one has been able to figure out." She would stand and the left starts shaking. When she had her SCS done, her spine doctor told her it had nothing to do with her spine and wanted her to go to the [facility]. The clonazepam helps with her right leg shaking as well. Last fall was a weeks ago, due to a combination of going up a step and right leg shaking. There is  no loss of awareness/consciousness with the leg shaking. She reports a sensation like a goose egg in in the right frontal region. There are times when after getting up she would have pain localized at that spot. It was felt to be due to her neck or prior surgeries. Pain occurs every other week but comes on pretty strong, she has noticed when really bad she would have dried blood in her nostrils. She takes Aleve or Tylenol which may or may not  help. She takes Dilaudid for her back but the head pain does not respond to this. She does not sleep well, with difficulties in sleep maintenance. She had been seeing a therapist in [facility] who also helped with her migraines. She reports myoclonic seizure where diagnosed in her mid to late 23s when they were occurring "all the time, up to 20 in a day." Her leg would jerk and she would kick the desk. At the same time, she was having headaches. She had an EEG and was diagnosed with myoclonic jerks at age 57. She was tried on different medications until she settled on Topamax in 1998 which has worked the best. She takes 25 in AM,  in PM and feels this dose is works best with her body metabolism. She went up to  in the apst which caused side effects, they were able to get the dose down when she had a VNS, then VNS was taken out in 2013. She moved back to [place] at the end of June, her divorce was finalized last week. She lives alone.   Review of prior notes on Epic from her neurologist in 2015: "she was labeled with clonic seizures, I do not have her long-term EEG report available from a different facility (2008), but reportedly it was described as normal." She had an EEG in 2015 with occasional focal delta slowing over the left temporal region, additionally occasional bitemporal theta slowing during wake state. She was evaluated by a Movement Disorder specialist in 2016 for tremors in both legs despite treatment with Baclofen, Gabapentin, Lyrica, Cymbalta, Topamax, Keppra, carbamazepine, valproic acid. It was noted the jerks occur along with vocal sounds. She has tremor in hands, legs and head occasionally, triggered by certain positions. 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 PT and exercise were recommended, as well as increase in clonazepam dose to 1 tab TID.  She brings a copy of her EMG/NCV of her legs done at [facility] in October 2019 showing bilateral, inactive, lumbosacral  radiculopathies (L5-S1 on the right and S2 on the left). She had numbness in her legs, worse after sitting for prolonged periods. She had incontinence when the setting on her SCS were too high.   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 There is no history of febrile convulsions, CNS infections such as meningitis/encephalitis, significant traumatic brain injury, neurosurgical procedures, or family history of seizures.  Prior ASMs: Gabapentin, Lyrica, Cymbalta, Topamax, Keppra, carbamazepine, valproic acid.   PAST MEDICAL HISTORY: Past Medical History:  Diagnosis Date   Arthritis, rheumatoid (HCC)    L1 vertebral fracture (HCC)    Lupus (HCC)    Seizure (HCC)    1998    MEDICATIONS: Current Outpatient Medications on File Prior to Visit  Medication Sig Dispense Refill   ascorbic acid (VITAMIN C) 1000 MG tablet      b complex vitamins capsule Take by mouth.     Calcium Carbonate-Vit D-Min (CALCIUM 1200 PO) Take by  mouth 2 (two) times daily.     Cholecalciferol 125 MCG (5000 UT) TABS 10,000 UT daily     clonazePAM (KLONOPIN) 0.5 MG tablet Take 1 tab in the morning 2 tabs at night 90 tablet 5   Collagen-Boron-Hyaluronic Acid (MOVE FREE ULTRA JOINT HEALTH) 40-5-3.3 MG TABS See admin instructions.     FLUoxetine (PROZAC) 20 MG capsule 1 capsule     folic acid (FOLVITE) 1 MG tablet Take 3 mg by mouth. 3 mg daily     golimumab (SIMPONI ARIA) 50 MG/4ML SOLN injection Strength: 50 mg/4 mL; Form: solution; SIG: take 0 intravenously every 8 weeks     HYDROmorphone HCl (EXALGO) 8 MG TB24 take 1 tablet by oral route  every day at the same time each day swallowing whole. Do not break, crush, dissolve and/or chew. DNF 01/08/21     magnesium gluconate (MAGONATE) 500 MG tablet Take by mouth.     methocarbamol (ROBAXIN) 750 MG tablet Take by mouth.     Methotrexate, PF, 30 MG/0.6ML SOAJ Inject into the skin.     Multiple Vitamins-Minerals (THERA-M) TABS  Take 1 tablet by mouth daily.     topiramate (TOPAMAX) 25 MG tablet Take 1 tablet in the morning, and 3 tablets at night at bedtime 360 tablet 3   vitamin E 180 MG (400 UNITS) capsule 1 capsule     No current facility-administered medications on file prior to visit.    ALLERGIES: Allergies  Allergen Reactions   Codeine Other (See Comments) and Itching   Erythromycin Nausea And Vomiting and Nausea Only   Hydrocodone    Fentanyl Rash    FAMILY HISTORY: No family history on file.  SOCIAL HISTORY: Social History   Socioeconomic History   Marital status: Divorced    Spouse name: Not on file   Number of children: Not on file   Years of education: Not on file   Highest education level: Not on file  Occupational History   Not on file  Tobacco Use   Smoking status: Never   Smokeless tobacco: Never  Vaping Use   Vaping Use: Never used  Substance and Sexual Activity   Alcohol use: Never   Drug use: Never   Sexual activity: Not on file  Other Topics Concern   Not on file  Social History Narrative   ** Merged History Encounter **       Left handed  Lives alone    Social Determinants of Health   Financial Resource Strain: Not on file  Food Insecurity: Not on file  Transportation Needs: Not on file  Physical Activity: Not on file  Stress: Not on file  Social Connections: Not on file  Intimate Partner Violence: Not on file     PHYSICAL EXAM: Vitals:   01/24/22 1116  BP: (!) 90/52  Pulse: 77  SpO2: 99%   General: No acute distress Head:  Normocephalic/atraumatic Skin/Extremities: No rash, no edema Neurological Exam: alert and awake. No aphasia or dysarthria. Fund of knowledge is appropriate. Attention and concentration are normal.   Cranial nerves: Pupils equal, round. Extraocular movements intact with no nystagmus. Visual fields full.  No facial asymmetry.  Motor: Bulk and tone normal, muscle strength 5/5 throughout except for 4/5 right LE due to pain. Reflexes +2  throughout. Finger to nose testing intact.  Gait narrow-based and steady, no ataxia   IMPRESSION: This is a 51 yo LH woman with a history of Chiari malformation s/p posterior fossa decompression and VP shunt  in 1991, history of myoclonic seizures s/p VNS/VNS removal in 2013, functional ataxia, chronic headaches, chronic back pain s/p SCS placement. Head CT no acute changes, EEG normal with right leg jerking showing normal EEG, indicating this is not epileptic. She was seen in the past by a Movement Disorders specialist and diagnosed with psychogenic movement disorder. Functional jerks and tremors occur in patients with chronic pain syndromes such as back pain. She is satisfied with control of symptoms on current regimen of Topiramate 25mg  in AM, 75mg  in PM and clonazepam 0.5mg  1 tab in AM, 2 tabs in PM. She is reporting pain radiating down the back of her right leg with bilateral hip pain, she will be referred for physical therapy. Follow-up in 6 months, call for any changes.    Thank you for allowing me to participate in her care.  Please do not hesitate to call for any questions or concerns.    Patrcia Dolly, M.D.   CC: Dr. Tenny Craw

## 2022-01-30 ENCOUNTER — Telehealth: Payer: Self-pay | Admitting: Neurology

## 2022-01-30 NOTE — Telephone Encounter (Signed)
Patient referred to [facility]. Phone number: [phone]. ? ?Called patient and provided her with contact information per above so that she may give them a call. Patient had no further questions or concerns.  ?

## 2022-01-30 NOTE — Telephone Encounter (Signed)
Patient called and said she got a text to click on a link for PT but it seemed to be from MyChart and she is not on MyChart. ? ?She'd like a call back about a referral for PT. ?

## 2022-02-01 DIAGNOSIS — Z6820 Body mass index (BMI) 20.0-20.9, adult: Secondary | ICD-10-CM | POA: Diagnosis not present

## 2022-02-01 DIAGNOSIS — R768 Other specified abnormal immunological findings in serum: Secondary | ICD-10-CM | POA: Diagnosis not present

## 2022-02-01 DIAGNOSIS — M25512 Pain in left shoulder: Secondary | ICD-10-CM | POA: Diagnosis not present

## 2022-02-01 DIAGNOSIS — M5136 Other intervertebral disc degeneration, lumbar region: Secondary | ICD-10-CM | POA: Diagnosis not present

## 2022-02-01 DIAGNOSIS — M0609 Rheumatoid arthritis without rheumatoid factor, multiple sites: Secondary | ICD-10-CM | POA: Diagnosis not present

## 2022-02-02 ENCOUNTER — Ambulatory Visit: Payer: Medicare HMO | Attending: Neurology

## 2022-02-02 ENCOUNTER — Other Ambulatory Visit: Payer: Self-pay

## 2022-02-02 DIAGNOSIS — R2689 Other abnormalities of gait and mobility: Secondary | ICD-10-CM | POA: Diagnosis not present

## 2022-02-02 DIAGNOSIS — M79604 Pain in right leg: Secondary | ICD-10-CM | POA: Insufficient documentation

## 2022-02-02 DIAGNOSIS — R262 Difficulty in walking, not elsewhere classified: Secondary | ICD-10-CM | POA: Diagnosis not present

## 2022-02-02 DIAGNOSIS — M6281 Muscle weakness (generalized): Secondary | ICD-10-CM | POA: Insufficient documentation

## 2022-02-02 DIAGNOSIS — R278 Other lack of coordination: Secondary | ICD-10-CM | POA: Diagnosis not present

## 2022-02-02 DIAGNOSIS — R2681 Unsteadiness on feet: Secondary | ICD-10-CM | POA: Insufficient documentation

## 2022-02-02 NOTE — Therapy (Signed)
?OUTPATIENT PHYSICAL THERAPY NEURO EVALUATION ? ? ?Patient Name: Robin Paul ?MRN: [id number] ?DOB:[birth year], 51 y.o., female ?Today's Date: 02/02/2022 ? ?PCP: Daisy Floro, MD ?REFERRING PROVIDER: Van Clines, MD ? ? PT End of Session - 02/02/22 1254   ? ? Visit Number 1   ? Number of Visits 17   ? Date for PT Re-Evaluation 03/30/22   ? Authorization Type [employer]   ? Authorization Time Period Awaiting Authorization   ? Progress Note Due on Visit 10   ? PT Start Time 1255   ? PT Stop Time 1340   ? PT Time Calculation (min) 45 min   ? Activity Tolerance Patient tolerated treatment well   ? Behavior During Therapy [facility] for tasks assessed/performed   ? ?  ?  ? ?  ? ? ?Past Medical History:  ?Diagnosis Date  ? Arthritis, rheumatoid (HCC)   ? L1 vertebral fracture (HCC)   ? Lupus (HCC)   ? Osteopenia   ? Seizure [facility])   ? 1998  ? ?Past Surgical History:  ?Procedure Laterality Date  ? SPINAL CORD STIMULATOR IMPLANT    ? ?There are no problems to display for this patient. ? ? ?ONSET DATE: 01/24/22 ? ?REFERRING DIAG: M79.604 (ICD-10-CM) - Right leg pain ? ?THERAPY DIAG:  ?Difficulty in walking, not elsewhere classified ? ?Other abnormalities of gait and mobility ? ?Other lack of coordination ? ?Unsteadiness on feet ? ?Muscle weakness (generalized) ? ?SUBJECTIVE:  ?                                                                                                                                                                                           ? ?SUBJECTIVE STATEMENT: ?Patient reports was diagnosed with Lupus and RA. Reports in a lupus flare up and feeling some brain fog. Reports she has rods from T10 to L3, still has a spinal stimulator to help with pain. Currently living with her father. Patient reports that she sustained a fall on her L side, and resulted in broken elbow as well as landed on the hip, and this has awakened pain into the leg that she has been unable to get rid of the discomfort/pain.  Reports that on the right side the posterior-lateral leg and is radiating down into the lower leg towards the ankle. Patient is followed by pain management as well as rheumatology. Patient presents with increased shakiness/jerkiness in the RLE. Reports she is having difficulty driving due to the pain. Balance also feels off, especially when she has an episode of the jerkiness when ambulating.  ? ?Pt accompanied by: self ? ?PERTINENT HISTORY: RA, L1  Vertebral Fx, Lupus, Osteopenia, Seizure, Chiari malformation s/p posterior decompression and VP shunt, myoclonic seizures s/p VNS/VNS removal in 2013, functional ataxia, and chronic headaches, Psychogenic Movement Disorder.  ?PAIN:  ?Are you having pain? Yes: NPRS scale: 7/10 ?Pain location: R Lower Extremity ?Pain description: Lambert Mody ?Aggravating factors: Prolonged Positioned ?Relieving factors: Pain Medication ? ?PRECAUTIONS: Fall and Other: RA, L1 Vertebral Fx, Lupus, Osteopenia, Seizure, Chiari malformation s/p posterior decompression and VP shunt, myoclonic seizures s/p VNS/VNS removal in 2013, functional ataxia, and chronic headaches, Psychogenic Movement Disorder, Spinal Stimulator ? ?WEIGHT BEARING RESTRICTIONS No ? ?FALLS: Has patient fallen in last 6 months? Yes, Number of falls: 2 ? ?LIVING ENVIRONMENT: ?Lives with: lives with their family; lives with her father ?Lives in: House/apartment ?Stairs: Yes; Internal: one flight steps; can reach both ?Has following equipment at home: None ? ?PLOF:  On Disability; Not Currently Working ? ?PATIENT GOALS Learn how to self manage pain in the RLE ? ?OBJECTIVE:  ? ?DIAGNOSTIC FINDINGS: EEG done 12/2020 was normal, episode of right leg jerking did not show any EEG correlate. She had a head CT without contrast in 01/2021 which did not show any acute changes, suboccipital craniectomy with mild underlying cerebellar encephalomalacia.  ? ?COGNITION: ?Overall cognitive status: Within functional limits for tasks  assessed ?  ?SENSATION: ?Appears Intact, but reports feels diminished on the L medial ankle.   ? ?COORDINATION: ?Toe Taps: WNL ?Heel Shin Test: Severe Coordination Deficits, severe jerkiness of BLE, but increased in RLE > LLE. Pt reports the shakiness will occur until she is exhausted.  ? ?POSTURE: No Significant postural limitations ? ?ROM:  Patient demonstrate no significant ROM limitations in B Hip and B Knee. However pain reported in R Lateral Knee with SLR. Patient TTP on lateral R Hip along ITB area. Increased tension noted in quads and lateral hip.  ? ?MMT:   ? ?MMT Right ?02/02/2022 Left ?02/02/2022  ?Hip flexion 4-/5 (7/10 lateral hip pain with R hip flexion) 4/5  ?Hip extension    ?Hip abduction    ?Hip adduction    ?Hip internal rotation    ?Hip external rotation    ?Knee flexion 4-/5 (4/10 pain in hamstring area)  4/5  ?Knee extension 4-/5 4/5  ?Ankle dorsiflexion    ?Ankle plantarflexion    ?Ankle inversion    ?Ankle eversion    ?(Blank rows = not tested) ? ?  Patient experienced jerkiness in BLE with all components of MMT.  ? ?TRANSFERS: ?Assistive device utilized: None  ?Sit to stand: SBA and CGA ?Stand to sit: SBA and CGA ? ? ?STAIRS: ? Level of Assistance: CGA ? Stair Negotiation Technique: Step to Pattern ?Alternating Pattern  ?Forwards with Bilateral Rails ? Number of Stairs: 8  ? Height of Stairs: 6  ?Comments: able to ascend step to pattern with minimal imbalance/jerkiness. With alternating pattern increased jerkiness noted specifically on RLE > LLE ? ?GAIT: ?Gait pattern: step through pattern, decreased step length- Right, decreased step length- Left, decreased stance time- Right, decreased hip/knee flexion- Right, and ataxic ?Distance walked: 100 ?Assistive device utilized: None ?Level of assistance: SBA and CGA ?Comments: mild unsteadiness noted with ambulation, patient decreased weight shift onto RLE with ambulation due to shooting pain into the R hip.  ?Gait Speed = 13.38 secs = 2.45  ft/sec ? ?FUNCTIONAL TESTs:  ?5 times sit to stand: 29.82 secs without UE support, CGA due to unsteadiness. Jerkiness noted in LE upon standing ? ? ? ?PATIENT EDUCATION: ?Education details: Educated on [employer]  Findings, Potential for Aquatic PT. ?Person educated: Patient ?Education method: Explanation ?Education comprehension: verbalized understanding ? ? ?HOME EXERCISE PROGRAM: ?To be established at next visit ? ? ? ?GOALS: ?Goals reviewed with patient? Yes ? ?SHORT TERM GOALS: Target date: 03/02/2022 ? ?Pt will be independent with initial HEP for improved balance/strength/stretching  ?Baseline: no HEP established ?Goal status: INITIAL ? ?2.  Pt will improve gait speed to >/= 2.62 ft/sec to demonstrate improved community ambulation ?Baseline: 2.45 ft/sec ?Goal status: INITIAL ? ?3.  Pt will improve 5x STS to </= 25 sec to demo improved functional LE strength and balance  ?Baseline: 29.82 ?Goal status: INITIAL ? ?4.  Pt will report 25% improvement in hip pain in functional activities  ?Baseline: pain limiting activities ?Goal status: INITIAL ? ?5.  Berg Balance TBA and LTG to be set as applicable ?Baseline: TBA ?Goal status: INITIAL ? ? ?LONG TERM GOALS: Target date: 03/30/2022 ? ?Pt will be independent with final progression land and aquatic HEP for improved strength/balance  ?Baseline: no HEP established ?Goal status: INITIAL ? ?2.  Pt will improve 5x STS to </= 20 sec to demo improved functional LE strength and balance  ?Baseline: 29.82 ?Goal status: INITIAL ? ?3.  Pt will improve gait speed to >/= 2.9 ft/sec to demonstrate improved community ambulation ?Baseline: 2.45 ft/sec ?Goal status: INITIAL ? ?4.  Pt will be able to ascend/descend stairs with reciprocal pattern and single rail without pain at Mod I level ?Baseline: supervision/CGA due to unsteadiness/jerkiness ?Goal status: INITIAL ? ?5.  LTG to be set for [employer]erg Balance ?Baseline: TBA ?Goal status: INITIAL ? ?6.  Pt will reports 50% improvement in hip pain  with functional activities and driving ?Baseline: limiting functional activities.  ?Goal status: INITIAL ? ?ASSESSMENT: ? ?CLINICAL IMPRESSION: ?Patient is a 51 y.o. female referred to Neuro OPPT services for R Leg Pain. Patient'

## 2022-02-09 ENCOUNTER — Ambulatory Visit: Payer: Medicare HMO | Admitting: Physical Therapy

## 2022-02-09 ENCOUNTER — Encounter: Payer: Self-pay | Admitting: Physical Therapy

## 2022-02-09 ENCOUNTER — Other Ambulatory Visit: Payer: Self-pay

## 2022-02-09 DIAGNOSIS — R262 Difficulty in walking, not elsewhere classified: Secondary | ICD-10-CM

## 2022-02-09 DIAGNOSIS — M6281 Muscle weakness (generalized): Secondary | ICD-10-CM | POA: Diagnosis not present

## 2022-02-09 DIAGNOSIS — R278 Other lack of coordination: Secondary | ICD-10-CM

## 2022-02-09 DIAGNOSIS — R2689 Other abnormalities of gait and mobility: Secondary | ICD-10-CM

## 2022-02-09 DIAGNOSIS — M79604 Pain in right leg: Secondary | ICD-10-CM | POA: Diagnosis not present

## 2022-02-09 DIAGNOSIS — R2681 Unsteadiness on feet: Secondary | ICD-10-CM

## 2022-02-09 NOTE — Patient Instructions (Signed)
?  Aquatic Therapy: What to Expect! ? ?Where:  ?Optician, dispensing at [facility] ?[address] ?[place], [place]  [address] ?[phone] ? ?NOTE:  You will receive an automated phone message reminding you of your appointment and it will say the appointment is at the [facility] on [address]  We are working to fix this- just know that you will meet [place] at the pool! ? ?How to Prepare: ?Please make sure you drink 8 ounces of water about one hour prior to your pool session ?Patients must wear either their street shoes or pool shoes until they are ready to enter the pool with the therapist.  Patients must also wear either street shoes or pool shoes once exiting the pool to walk to the locker room.  This will helps [place] prevent slips and falls.  ?Please arrive 15 minutes early to prepare for your pool therapy session ?You may use the locker rooms on your right and then enter directly into the recreation pool (NOT the competition pool) ?Please make sure to attend to any toileting needs prior to entering the pool ?Please be dressed in your swim suit and on the pool deck at least 5 minutes before your appointment ?Once on the pool deck your therapist will ask you to sign the Patient  Consent and Assignment of Benefits form ?Your therapist may take your blood pressure prior to, during and after your session if indicated ? ?About the pool  and parking: ?Entering the pool ?Your therapist will assist you; there are 2 ways to enter:  stairs with railings or with a chair lift.   Your therapist will determine the most appropriate way for you. ?Water temperature is usually between 89-92 degrees ?Parking is free.   ? ?Contact Info:     Appointments: ?[facility]  All sessions are 45 minutes   ?[address]  [address]     Please call the [facility] if   ?[place], [place]   [address]    you need to cancel or reschedule an appointment.  ?[phone]     ?  ?

## 2022-02-09 NOTE — Therapy (Addendum)
?OUTPATIENT PHYSICAL THERAPY TREATMENT NOTE ? ? ?Patient Name: Robin Paul ?MRN: [id number] ?DOB:1971/09/16, 51 y.o., female ?Today's Date: 02/09/2022 ? ?PCP: Lawerance Cruel, MD ?REFERRING PROVIDER: Cameron Sprang, MD  ? ? PT End of Session - 02/09/22 0933   ? ? Visit Number 2   ? Number of Visits 17   ? Date for PT Re-Evaluation 03/30/22   ? Authorization Type [employer]   ? Authorization Time Period 17 visits from 02/02/22- 03/30/22   ? Authorization - Visit Number 2   ? Authorization - Number of Visits 17   ? Progress Note Due on Visit 10   ? PT Start Time [id number]   ? PT Stop Time 1014   ? PT Time Calculation (min) 42 min   ? Activity Tolerance Patient tolerated treatment well   ? Behavior During Therapy [facility] for tasks assessed/performed   ? ?  ?  ? ?  ? ? ?Past Medical History:  ?Diagnosis Date  ? Arthritis, rheumatoid ([place])   ? L1 vertebral fracture ([facility])   ? Lupus ([place])   ? Osteopenia   ? Seizure [facility])   ? 1998  ? ?Past Surgical History:  ?Procedure Laterality Date  ? SPINAL CORD STIMULATOR IMPLANT    ? ?There are no problems to display for this patient. ? ? ?REFERRING DIAG: M79.604 (ICD-10-CM) - Right leg pain  ? ?THERAPY DIAG:  ?Difficulty in walking, not elsewhere classified ? ?Other abnormalities of gait and mobility ? ?Unsteadiness on feet ? ?Muscle weakness (generalized) ? ?Other lack of coordination ? ?PERTINENT HISTORY: RA, L1 Vertebral Fx, Lupus, Osteopenia, Seizure, Chiari malformation s/p posterior decompression and VP shunt, myoclonic seizures s/p VNS/VNS removal in 2013, functional ataxia, and chronic headaches, Psychogenic Movement Disorder.  ? ?PRECAUTIONS: RA, L1 Vertebral Fx, Lupus, Osteopenia, Seizure, Chiari malformation s/p posterior decompression and VP shunt, myoclonic seizures s/p VNS/VNS removal in 2013, functional ataxia, and chronic headaches, Psychogenic Movement Disorder.  ? ?SUBJECTIVE: No new complaints. No falls.  Has placed herself on supplements for RA. ? ?PAIN:  ?Are you  having pain? Yes ?NPRS scale: 6/10 ?Pain location: generalized due to RA, mostly in back ?Pain orientation: Other: back and joints   ?PAIN TYPE: Chronic ?Pain description: constant, aching, and sharp nerve pain a times   ?Aggravating factors: increased activity, increased today due to forgetting to turn spinal stimulator back on ?Relieving factors: medication, spinal stimulator, lidocaine patches as needed, heat at times  ? ?                                                                                                                                                                       ?  ?  ?  ?TODAY'S TREATMENT: ?  02/09/2022 ?  NMR: ? [facility] PT Assessment - 02/09/22 0938   ? ?  ? Standardized Balance Assessment  ? Standardized Balance Assessment Berg Balance Test   ?  ? Berg Balance Test  ? Sit to Stand Able to stand without using hands and stabilize independently   ? Standing Unsupported Able to stand 2 minutes with supervision   ? Sitting with Back Unsupported but Feet Supported on Floor or Stool Able to sit safely and securely 2 minutes   ? Stand to Sit Controls descent by using hands   ? Transfers Able to transfer safely, minor use of hands   ? Standing Unsupported with Eyes Closed Able to stand 3 seconds   ? Standing Unsupported with Feet Together Able to place feet together independently and stand for 1 minute with supervision   ? From Standing, Reach Forward with Outstretched Arm Can reach forward >12 cm safely (5")   8 inches  ? From Standing Position, Pick up Object from Floor Able to pick up shoe, needs supervision   ? From Standing Position, Turn to Look Behind Over each Shoulder Looks behind one side only/other side shows less weight shift   right>left  ? Turn 360 Degrees Needs close supervision or verbal cueing   ? Standing Unsupported, Alternately Place Feet on Step/Stool Able to stand independently and complete 8 steps >20 seconds   29.03 sec's  ? Standing Unsupported, One Foot in Front Able to  plae foot ahead of the other independently and hold 30 seconds   ? Standing on One Leg Tries to lift leg/unable to hold 3 seconds but remains standing independently   ? Total Score 40   ? Berg comment: 40/56. 37-45 significant risk   ? ?  ?  ? ? SELF CARE: ? Discussed aquatic therapy schedule. Pt able to do Monday afternoons. Information on pool site provided today. ? ? STRENGTHENING  ? Issued HEP for stretching and strengthening. Cues on correct form and technique needed. Min guard assist for safety.  ?  ?  ? ?  ?  ?  ?PATIENT EDUCATION: ?Education details: results of Furniture conservator/restorer, Aquatic therapy info for starting on Monday, intial HEP ?Person educated: Patient ?Education method: Explanation ?Education comprehension: verbalized understanding ?  ?  ?HOME EXERCISE PROGRAM: ?Access Code: [email or web address] ?URL: [email or web address] ?Date: 02/09/2022 ?Prepared by: Willow Ora ? ?Exercises ?- Seated Hamstring Stretch  - 1 x daily - 5 x weekly - 1 sets - 3 reps - 30 hold ?- Sit to Stand with Counter Support  - 1 x daily - 5 x weekly - 1 sets - 8-10 reps ? ?  ?GOALS: ? ?  ?SHORT TERM GOALS: Target date: 03/02/2022 ?  ?Pt will be independent with initial HEP for improved balance/strength/stretching  ?Baseline: no HEP established ?Goal status: INITIAL ?  ?2.  Pt will improve gait speed to >/= 2.62 ft/sec to demonstrate improved community ambulation ?Baseline: 2.45 ft/sec ?Goal status: INITIAL ?  ?3.  Pt will improve 5x STS to </= 25 sec to demo improved functional LE strength and balance  ?Baseline: 29.82 ?Goal status: INITIAL ?  ?4.  Pt will report 25% improvement in hip pain in functional activities  ?Baseline: pain limiting activities ?Goal status: INITIAL ?  ?5. Pt will improve Berg Balance to >/= 44/56 to demonstrate improved standing balance and reduced fall risk ?Baseline: 40/56 ?Goal status: INITIAL ?  ?  ?LONG TERM GOALS: Target date: 03/30/2022 ?  ?Pt will be independent with final progression land  and  aquatic HEP for improved strength/balance  ?Baseline: no HEP established ?Goal status: INITIAL ?  ?2.  Pt will improve 5x STS to </= 20 sec to demo improved functional LE strength and balance  ?Baseline: 29.82 ?Goal status: INITIAL ?  ?3.  Pt will improve gait speed to >/= 2.9 ft/sec to demonstrate improved community ambulation ?Baseline: 2.45 ft/sec ?Goal status: INITIAL ?  ?4.  Pt will be able to ascend/descend stairs with reciprocal pattern and single rail without pain at Mod I level ?Baseline: supervision/CGA due to unsteadiness/jerkiness ?Goal status: INITIAL ?  ?5.  Pt will improve Berg Balance to >/= 48/56 to demonstrate improved standing balance and reduced fall risk ?Baseline: 40/56 ?Goal status: INITIAL ?  ?6.  Pt will reports 50% improvement in hip pain with functional activities and driving ?Baseline: limiting functional activities.  ?Goal status: INITIAL ?  ?ASSESSMENT: ?  ?CLINICAL IMPRESSION: ?Today's skilled session focused on establishment of baseline score for Berg Balance test with primary PT to update goals. Remainder of session focused on going over information for starting aquatic therapy on Monday 02/12/22 and then establishment of an initial HEP for strengthening and balance. No issues noted or reported in session today. Pt does continue with LE tremors at random times and continues to demonstrate posterior bias with balance loss. The pt should benefit from continued PT to progress toward unmet goals. ?  ?  ?OBJECTIVE IMPAIRMENTS Abnormal gait, decreased activity tolerance, decreased balance, decreased coordination, decreased endurance, difficulty walking, decreased strength, increased muscle spasms, impaired sensation, and pain.  ?  ?ACTIVITY LIMITATIONS cleaning, community activity, driving, meal prep, and laundry.  ?  ?PERSONAL FACTORS Time since onset of injury/illness/exacerbation and 3+ comorbidities: RA, L1 Vertebral Fx, Lupus, Osteopenia, Seizure, Chiari malformation s/p posterior  decompression and VP shunt, myoclonic seizures s/p VNS/VNS removal in 2013, functional ataxia, and chronic headaches, Psychogenic Movement Disorder  are also affecting patient's functional outcome.  ?  ?  ?[facility]

## 2022-02-09 NOTE — Therapy (Deleted)
?OUTPATIENT PHYSICAL THERAPY NEURO EVALUATION ? ? ?Patient Name: Robin Paul ?MRN: [id number] ?DOB:01/30/1971, 51 y.o., female ?Today's Date: 02/09/2022 ? ?PCP: Daisy Floro, MD ?REFERRING PROVIDER: Daisy Floro, MD ? ? PT End of Session - 02/09/22 0933   ? ? Visit Number 2   ? Number of Visits 17   ? Date for PT Re-Evaluation 03/30/22   ? Authorization Type [employer]   ? Authorization Time Period 17 visits from 02/02/22- 03/30/22   ? Authorization - Visit Number 2   ? Authorization - Number of Visits 17   ? Progress Note Due on Visit 10   ? PT Start Time 0932   ? Activity Tolerance Patient tolerated treatment well   ? Behavior During Therapy [facility] for tasks assessed/performed   ? ?  ?  ? ?  ? ? ?Past Medical History:  ?Diagnosis Date  ? Arthritis, rheumatoid (HCC)   ? L1 vertebral fracture (HCC)   ? Lupus ([facility])   ? Osteopenia   ? Seizure [facility])   ? 1998  ? ?Past Surgical History:  ?Procedure Laterality Date  ? SPINAL CORD STIMULATOR IMPLANT    ? ?There are no problems to display for this patient. ? ? ?ONSET DATE: 01/24/22 ? ?REFERRING DIAG: M79.604 (ICD-10-CM) - Right leg pain ? ?THERAPY DIAG:  ?Difficulty in walking, not elsewhere classified ? ?Other abnormalities of gait and mobility ? ?Unsteadiness on feet ? ?Muscle weakness (generalized) ? ?Other lack of coordination ? ?SUBJECTIVE:  ?                                                                                                                                                                                           ? ?SUBJECTIVE STATEMENT: ?Patient reports was diagnosed with Lupus and RA. Reports in a lupus flare up and feeling some brain fog. Reports she has rods from T10 to L3, still has a spinal stimulator to help with pain. Currently living with her father. Patient reports that she sustained a fall on her L side, and resulted in broken elbow as well as landed on the hip, and this has awakened pain into the leg that she has been unable to get  rid of the discomfort/pain. Reports that on the right side the posterior-lateral leg and is radiating down into the lower leg towards the ankle. Patient is followed by pain management as well as rheumatology. Patient presents with increased shakiness/jerkiness in the RLE. Reports she is having difficulty driving due to the pain. Balance also feels off, especially when she has an episode of the jerkiness when ambulating.  ? ?Pt accompanied by: self ? ?  PERTINENT HISTORY: RA, L1 Vertebral Fx, Lupus, Osteopenia, Seizure, Chiari malformation s/p posterior decompression and VP shunt, myoclonic seizures s/p VNS/VNS removal in 2013, functional ataxia, and chronic headaches, Psychogenic Movement Disorder.  ?PAIN:  ?Are you having pain? Yes: NPRS scale: 7/10 ?Pain location: R Lower Extremity ?Pain description: Lambert Mody ?Aggravating factors: Prolonged Positioned ?Relieving factors: Pain Medication ? ?PRECAUTIONS: Fall and Other: RA, L1 Vertebral Fx, Lupus, Osteopenia, Seizure, Chiari malformation s/p posterior decompression and VP shunt, myoclonic seizures s/p VNS/VNS removal in 2013, functional ataxia, and chronic headaches, Psychogenic Movement Disorder, Spinal Stimulator ? ?WEIGHT BEARING RESTRICTIONS No ? ?FALLS: Has patient fallen in last 6 months? Yes, Number of falls: 2 ? ?LIVING ENVIRONMENT: ?Lives with: lives with their family; lives with her father ?Lives in: House/apartment ?Stairs: Yes; Internal: one flight steps; can reach both ?Has following equipment at home: None ? ?PLOF:  On Disability; Not Currently Working ? ?PATIENT GOALS Learn how to self manage pain in the RLE ? ?OBJECTIVE:  ? ?DIAGNOSTIC FINDINGS: EEG done 12/2020 was normal, episode of right leg jerking did not show any EEG correlate. She had a head CT without contrast in 01/2021 which did not show any acute changes, suboccipital craniectomy with mild underlying cerebellar encephalomalacia.  ? ?COGNITION: ?Overall cognitive status: Within functional limits  for tasks assessed ?  ?SENSATION: ?Appears Intact, but reports feels diminished on the L medial ankle.   ? ?COORDINATION: ?Toe Taps: WNL ?Heel Shin Test: Severe Coordination Deficits, severe jerkiness of BLE, but increased in RLE > LLE. Pt reports the shakiness will occur until she is exhausted.  ? ?POSTURE: No Significant postural limitations ? ?ROM:  Patient demonstrate no significant ROM limitations in B Hip and B Knee. However pain reported in R Lateral Knee with SLR. Patient TTP on lateral R Hip along ITB area. Increased tension noted in quads and lateral hip.  ? ?MMT:   ? ?MMT Right ?02/09/2022 Left ?02/09/2022  ?Hip flexion 4-/5 (7/10 lateral hip pain with R hip flexion) 4/5  ?Hip extension    ?Hip abduction    ?Hip adduction    ?Hip internal rotation    ?Hip external rotation    ?Knee flexion 4-/5 (4/10 pain in hamstring area)  4/5  ?Knee extension 4-/5 4/5  ?Ankle dorsiflexion    ?Ankle plantarflexion    ?Ankle inversion    ?Ankle eversion    ?(Blank rows = not tested) ? ?  Patient experienced jerkiness in BLE with all components of MMT.  ? ?TRANSFERS: ?Assistive device utilized: None  ?Sit to stand: SBA and CGA ?Stand to sit: SBA and CGA ? ? ?STAIRS: ? Level of Assistance: CGA ? Stair Negotiation Technique: Step to Pattern ?Alternating Pattern  ?Forwards with Bilateral Rails ? Number of Stairs: 8  ? Height of Stairs: 6  ?Comments: able to ascend step to pattern with minimal imbalance/jerkiness. With alternating pattern increased jerkiness noted specifically on RLE > LLE ? ?GAIT: ?Gait pattern: step through pattern, decreased step length- Right, decreased step length- Left, decreased stance time- Right, decreased hip/knee flexion- Right, and ataxic ?Distance walked: 100 ?Assistive device utilized: None ?Level of assistance: SBA and CGA ?Comments: mild unsteadiness noted with ambulation, patient decreased weight shift onto RLE with ambulation due to shooting pain into the R hip.  ?Gait Speed = 13.38 secs =  2.45 ft/sec ? ?FUNCTIONAL TESTs:  ?5 times sit to stand: 29.82 secs without UE support, CGA due to unsteadiness. Jerkiness noted in LE upon standing ? ? ? ?PATIENT EDUCATION: ?Education  details: Educated on POC/Eval [employer] for Aquatic PT. ?Person educated: Patient ?Education method: Explanation ?Education comprehension: verbalized understanding ? ? ?HOME EXERCISE PROGRAM: ?To be established at next visit ? ? ? ?GOALS: ?Goals reviewed with patient? Yes ? ?SHORT TERM GOALS: Target date: 03/02/2022 ? ?Pt will be independent with initial HEP for improved balance/strength/stretching  ?Baseline: no HEP established ?Goal status: INITIAL ? ?2.  Pt will improve gait speed to >/= 2.62 ft/sec to demonstrate improved community ambulation ?Baseline: 2.45 ft/sec ?Goal status: INITIAL ? ?3.  Pt will improve 5x STS to </= 25 sec to demo improved functional LE strength and balance  ?Baseline: 29.82 ?Goal status: INITIAL ? ?4.  Pt will report 25% improvement in hip pain in functional activities  ?Baseline: pain limiting activities ?Goal status: INITIAL ? ?5.  Berg Balance TBA and LTG to be set as applicable ?Baseline: TBA ?Goal status: INITIAL ? ? ?LONG TERM GOALS: Target date: 03/30/2022 ? ?Pt will be independent with final progression land and aquatic HEP for improved strength/balance  ?Baseline: no HEP established ?Goal status: INITIAL ? ?2.  Pt will improve 5x STS to </= 20 sec to demo improved functional LE strength and balance  ?Baseline: 29.82 ?Goal status: INITIAL ? ?3.  Pt will improve gait speed to >/= 2.9 ft/sec to demonstrate improved community ambulation ?Baseline: 2.45 ft/sec ?Goal status: INITIAL ? ?4.  Pt will be able to ascend/descend stairs with reciprocal pattern and single rail without pain at Mod I level ?Baseline: supervision/CGA due to unsteadiness/jerkiness ?Goal status: INITIAL ? ?5.  LTG to be set for [employer] ?Baseline: TBA ?Goal status: INITIAL ? ?6.  Pt will reports 50% improvement in hip  pain with functional activities and driving ?Baseline: limiting functional activities.  ?Goal status: INITIAL ? ?ASSESSMENT: ? ?CLINICAL IMPRESSION: ? ? ? ? ?OBJECTIVE IMPAIRMENTS Abnormal gait, decreased a

## 2022-02-12 ENCOUNTER — Ambulatory Visit: Payer: Medicare HMO | Admitting: Physical Therapy

## 2022-02-12 DIAGNOSIS — R2689 Other abnormalities of gait and mobility: Secondary | ICD-10-CM | POA: Diagnosis not present

## 2022-02-12 DIAGNOSIS — R2681 Unsteadiness on feet: Secondary | ICD-10-CM | POA: Diagnosis not present

## 2022-02-12 DIAGNOSIS — M6281 Muscle weakness (generalized): Secondary | ICD-10-CM

## 2022-02-12 DIAGNOSIS — R278 Other lack of coordination: Secondary | ICD-10-CM

## 2022-02-12 DIAGNOSIS — M79604 Pain in right leg: Secondary | ICD-10-CM | POA: Diagnosis not present

## 2022-02-12 DIAGNOSIS — R262 Difficulty in walking, not elsewhere classified: Secondary | ICD-10-CM

## 2022-02-13 ENCOUNTER — Other Ambulatory Visit: Payer: Self-pay

## 2022-02-13 ENCOUNTER — Ambulatory Visit: Payer: Medicare HMO

## 2022-02-13 ENCOUNTER — Encounter: Payer: Self-pay | Admitting: Physical Therapy

## 2022-02-13 NOTE — Therapy (Signed)
?OUTPATIENT PHYSICAL THERAPY TREATMENT NOTE ? ? ?Patient Name: Robin Paul ?MRN: [id number] ?DOB:January 23, 1971, 51 y.o., female ?Today's Date: 02/13/2022 ? ?PCP: Daisy Floro, MD ?REFERRING PROVIDER: Van Clines, MD  ? ? PT End of Session - 02/12/22 1630   ? ? Visit Number 3   ? Number of Visits 17   ? Date for PT Re-Evaluation 03/30/22   ? Authorization Type [employer]   ? Authorization Time Period 17 visits from 02/02/22- 03/30/22   ? Authorization - Visit Number 3   ? Authorization - Number of Visits 17   ? Progress Note Due on Visit 10   ? PT Start Time 1317   ? PT Stop Time 1400   ? PT Time Calculation (min) 43 min   ? Equipment Utilized During Treatment Other (comment)   pool noodle, aquatic cuffs, large bar bell  ? Activity Tolerance Patient tolerated treatment well   ? Behavior During Therapy [facility] for tasks assessed/performed   ? ?  ?  ? ?  ? ? ?Past Medical History:  ?Diagnosis Date  ? Arthritis, rheumatoid (HCC)   ? L1 vertebral fracture (HCC)   ? Lupus (HCC)   ? Osteopenia   ? Seizure [facility])   ? 1998  ? ?Past Surgical History:  ?Procedure Laterality Date  ? SPINAL CORD STIMULATOR IMPLANT    ? ?There are no problems to display for this patient. ? ? ?REFERRING DIAG: M79.604 (ICD-10-CM) - Right leg pain  ? ?THERAPY DIAG:  ?Difficulty in walking, not elsewhere classified ? ?Other abnormalities of gait and mobility ? ?Unsteadiness on feet ? ?Muscle weakness (generalized) ? ?Other lack of coordination ? ?PERTINENT HISTORY: RA, L1 Vertebral Fx, Lupus, Osteopenia, Seizure, Chiari malformation s/p posterior decompression and VP shunt, myoclonic seizures s/p VNS/VNS removal in 2013, functional ataxia, and chronic headaches, Psychogenic Movement Disorder.  ? ?PRECAUTIONS: RA, L1 Vertebral Fx, Lupus, Osteopenia, Seizure, Chiari malformation s/p posterior decompression and VP shunt, myoclonic seizures s/p VNS/VNS removal in 2013, functional ataxia, and chronic headaches, Psychogenic Movement Disorder.   ? ?SUBJECTIVE: No new complaints. No falls.  Has placed herself on supplements for RA. ? ?PAIN:  ?Are you having pain? Yes ?NPRS scale: 6/10 ?Pain location: generalized due to RA, mostly in back ?Pain orientation: Other: back and joints   ?PAIN TYPE: Chronic ?Pain description: constant, aching, and sharp nerve pain a times   ?Aggravating factors: increased activity, increased today due to forgetting to turn spinal stimulator back on ?Relieving factors: medication, spinal stimulator, lidocaine patches as needed, heat at times  ? ?                                                                                                                                                                       ?  ?  ?  ?  TODAY'S TREATMENT: ?  02/12/2022 ?Aquatic therapy at [facility] - pool temp 92 degrees ? ? ? Patient seen for aquatic therapy today.  Treatment took place in water 3.5-4.5 feet deep depending upon activity.  Pt entered/exited pool via stairs with bil rails, step to pattern with min guard assist.  ? ?With yellow pool noodle ?Forward ?Backward ?Side stepping ? ?At wall with aquatic cuffs bil LE's ?Heel<>toe raises ?Hip side kicks ?Hip back kick ?HS curls ?Attempted marching with increased jerking on LE's noted ? ?With large bar bell ?Marching for 18 feet across pool ? ?With aquatic pillow/noodle across back and under arms for floating in supine ?Hip abd/add ?Scissor kicks ?Bicycling  ?  ? ? ? ? ? ?Pt requires buoyancy of water for support for reduced fall risk with gait training and balance exercises with minimal UE support; exercises able to be performed safely in water without the risk of fall compared to those same exercises performed on land;  viscosity of water needed for resistance for strengthening.  Current of water provides perturbations for challenging static & dynamic standing balance.    ?   ? ?  ?  ? ?  ?  ?  ?PATIENT EDUCATION: ?Education details: results of Berg Balance test, Aquatic therapy info for  starting on Monday, intial HEP ?Person educated: Patient ?Education method: Explanation ?Education comprehension: verbalized understanding ?  ?  ?HOME EXERCISE PROGRAM: ?Access Code: [email or web address] ?URL: [email or web address] ?Date: 02/09/2022 ?Prepared by: Sallyanne Kuster ? ?Exercises ?- Seated Hamstring Stretch  - 1 x daily - 5 x weekly - 1 sets - 3 reps - 30 hold ?- Sit to Stand with Counter Support  - 1 x daily - 5 x weekly - 1 sets - 8-10 reps ? ?  ?GOALS: ? ?  ?SHORT TERM GOALS: Target date: 03/02/2022 ?  ?Pt will be independent with initial HEP for improved balance/strength/stretching  ?Baseline: no HEP established ?Goal status: INITIAL ?  ?2.  Pt will improve gait speed to >/= 2.62 ft/sec to demonstrate improved community ambulation ?Baseline: 2.45 ft/sec ?Goal status: INITIAL ?  ?3.  Pt will improve 5x STS to </= 25 sec to demo improved functional LE strength and balance  ?Baseline: 29.82 ?Goal status: INITIAL ?  ?4.  Pt will report 25% improvement in hip pain in functional activities  ?Baseline: pain limiting activities ?Goal status: INITIAL ?  ?5. Pt will improve Berg Balance to >/= 44/56 to demonstrate improved standing balance and reduced fall risk ?Baseline: 40/56 ?Goal status: INITIAL ?  ?  ?LONG TERM GOALS: Target date: 03/30/2022 ?  ?Pt will be independent with final progression land and aquatic HEP for improved strength/balance  ?Baseline: no HEP established ?Goal status: INITIAL ?  ?2.  Pt will improve 5x STS to </= 20 sec to demo improved functional LE strength and balance  ?Baseline: 29.82 ?Goal status: INITIAL ?  ?3.  Pt will improve gait speed to >/= 2.9 ft/sec to demonstrate improved community ambulation ?Baseline: 2.45 ft/sec ?Goal status: INITIAL ?  ?4.  Pt will be able to ascend/descend stairs with reciprocal pattern and single rail without pain at Mod I level ?Baseline: supervision/CGA due to unsteadiness/jerkiness ?Goal status: INITIAL ?  ?5.  Pt will improve Berg Balance to >/= 48/56  to demonstrate improved standing balance and reduced fall risk ?Baseline: 40/56 ?Goal status: INITIAL ?  ?6.  Pt will reports 50% improvement in hip pain with functional activities and driving ?Baseline: limiting functional activities.  ?Goal status: INITIAL ?  ?  ASSESSMENT: ?  ?CLINICAL IMPRESSION: ?Today's skilled session  ?  ?  ?OBJECTIVE IMPAIRMENTS Abnormal gait, decreased activity tolerance, decreased balance, decreased coordination, decreased endurance, difficulty walking, decreased strength, increased muscle spasms, impaired sensation, and pain.  ?  ?ACTIVITY LIMITATIONS cleaning, community activity, driving, meal prep, and laundry.  ?  ?PERSONAL FACTORS Time since onset of injury/illness/exacerbation and 3+ comorbidities: RA, L1 Vertebral Fx, Lupus, Osteopenia, Seizure, Chiari malformation s/p posterior decompression and VP shunt, myoclonic seizures s/p VNS/VNS removal in 2013, functional ataxia, and chronic headaches, Psychogenic Movement Disorder  are also affecting patient's functional outcome.  ?  ?  ?REHAB POTENTIAL: Good ?  ?CLINICAL DECISION MAKING: Evolving/moderate complexity ?  ?EVALUATION COMPLEXITY: Moderate ?  ?PLAN: ?PT FREQUENCY: 2x/week ?  ?PT DURATION: 8 weeks ?  ?PLANNED INTERVENTIONS: Therapeutic exercises, Therapeutic activity, Neuromuscular re-education, Balance training, Gait training, Patient/Family education, Joint manipulation, Joint mobilization, Stair training, Vestibular training, DME instructions, Aquatic Therapy, Dry Needling, Cryotherapy, Moist heat, and Manual therapy ?  ?PLAN FOR NEXT SESSION:  ?     Add to HEP more strengthening and balance ex's; work on coordination and balance- tall kneeling, quadruped activities ? ? ? ? ? ? ?Sallyanne KusterKathy Tenea Sens, PTA, CLT ?[facility] ?[address], [address] ?[place] [address] ?[phone] ?02/13/22, 12:57 PM  ? ?   ?

## 2022-02-15 ENCOUNTER — Encounter: Payer: Self-pay | Admitting: Physical Therapy

## 2022-02-15 ENCOUNTER — Ambulatory Visit: Payer: Medicare HMO | Admitting: Physical Therapy

## 2022-02-15 DIAGNOSIS — M6281 Muscle weakness (generalized): Secondary | ICD-10-CM

## 2022-02-15 DIAGNOSIS — R2681 Unsteadiness on feet: Secondary | ICD-10-CM

## 2022-02-15 DIAGNOSIS — R2689 Other abnormalities of gait and mobility: Secondary | ICD-10-CM | POA: Diagnosis not present

## 2022-02-15 DIAGNOSIS — R278 Other lack of coordination: Secondary | ICD-10-CM | POA: Diagnosis not present

## 2022-02-15 DIAGNOSIS — M79604 Pain in right leg: Secondary | ICD-10-CM | POA: Diagnosis not present

## 2022-02-15 DIAGNOSIS — R262 Difficulty in walking, not elsewhere classified: Secondary | ICD-10-CM | POA: Diagnosis not present

## 2022-02-15 NOTE — Therapy (Signed)
?OUTPATIENT PHYSICAL THERAPY TREATMENT NOTE ? ? ?Patient Name: Pinkey Paul ?MRN: [id number] ?DOB:25-Mar-1971, 51 y.o., female ?Today's Date: 02/15/2022 ? ?PCP: Lawerance Cruel, MD ?REFERRING PROVIDER: Cameron Sprang, MD  ? ? PT End of Session - 02/15/22 1107   ? ? Visit Number 4   ? Number of Visits 17   ? Date for PT Re-Evaluation 03/30/22   ? Authorization Type [employer]   ? Authorization Time Period 17 visits from 02/02/22- 03/30/22   ? Authorization - Visit Number 4   ? Authorization - Number of Visits 17   ? Progress Note Due on Visit 10   ? PT Start Time 1105   ? PT Stop Time 1145   ? PT Time Calculation (min) 40 min   ? Equipment Utilized During Treatment --   ? Activity Tolerance Patient tolerated treatment well   ? Behavior During Therapy [facility] for tasks assessed/performed   ? ?  ?  ? ?  ? ? ?Past Medical History:  ?Diagnosis Date  ? Arthritis, rheumatoid ([place])   ? L1 vertebral fracture (HCC)   ? Lupus ([place])   ? Osteopenia   ? Seizure [facility])   ? 1998  ? ?Past Surgical History:  ?Procedure Laterality Date  ? SPINAL CORD STIMULATOR IMPLANT    ? ?There are no problems to display for this patient. ? ? ?REFERRING DIAG: M79.604 (ICD-10-CM) - Right leg pain  ? ?THERAPY DIAG:  ?Difficulty in walking, not elsewhere classified ? ?Other abnormalities of gait and mobility ? ?Unsteadiness on feet ? ?Muscle weakness (generalized) ? ?PERTINENT HISTORY: RA, L1 Vertebral Fx, Lupus, Osteopenia, Seizure, Chiari malformation s/p posterior decompression and VP shunt, myoclonic seizures s/p VNS/VNS removal in 2013, functional ataxia, and chronic headaches, Psychogenic Movement Disorder.  ? ?PRECAUTIONS: RA, L1 Vertebral Fx, Lupus, Osteopenia, Seizure, Chiari malformation s/p posterior decompression and VP shunt, myoclonic seizures s/p VNS/VNS removal in 2013, functional ataxia, and chronic headaches, Psychogenic Movement Disorder.  ? ?SUBJECTIVE: No new complaints. Felt great after the pool. It was 2 days before the pain  got bad again. No falls.  ? ?PAIN:  ?Are you having pain? Yes ?NPRS scale: 3-4/10 ?Pain location: generalized due to RA, mostly in hips today ?Pain orientation: Other: back and joints   ?PAIN TYPE: Chronic ?Pain description: constant, aching, and sharp nerve pain a times   ?Aggravating factors: increased activity,and prolonged immobility ?Relieving factors: medication, spinal stimulator, lidocaine patches as needed, heat at times  ? ?                                                                                                                                                                       ?  ?  ?  ?TODAY'S TREATMENT: ?  02/15/2022 ? NMR: ? Added to HEP balance ex's. Refer to [place] program in HEP section for full details.  Bold ex's are ones done today in session with cues for form and technique. Min guard assist for balance.  ? ?   ? ?  ?  ?  ?PATIENT EDUCATION: ?Education details: additions to HEP ?Person educated: Patient ?Education method: Explanation ?Education comprehension: verbalized understanding ?  ?  ?HOME EXERCISE PROGRAM: ?Access Code: [email or web address] ?URL: [email or web address] ?Date: 02/15/2022 ?Prepared by: Willow Ora ? ?Exercises ?- Seated Hamstring Stretch  - 1 x daily - 5 x weekly - 1 sets - 3 reps - 30 hold ?- Sit to Stand with Counter Support  - 1 x daily - 5 x weekly - 1 sets - 8-10 reps ?Added to HEP today (02/15/22): ?- Standing Balance in Corner with Eyes Closed  - 1 x daily - 5 x weekly - 1 sets - 3 reps - 30 seconds hold ?- Standing Near Stance in Corner  - 1 x daily - 5 x weekly - 1 sets - 10 reps ?- Wide Tandem Stance with Eyes Closed  - 1 x daily - 5 x weekly - 1 sets - 2 reps - 15 seconds hold ?- Tandem Walking with Counter Support  - 1 x daily - 5 x weekly - 1 sets - 3 reps ?- Walking March  - 1 x daily - 5 x weekly - 1 sets - 3 reps ? ?  ?GOALS: ? ?  ?SHORT TERM GOALS: Target date: 03/02/2022 ?  ?Pt will be independent with initial HEP for improved  balance/strength/stretching  ?Baseline: no HEP established ?Goal status: INITIAL ?  ?2.  Pt will improve gait speed to >/= 2.62 ft/sec to demonstrate improved community ambulation ?Baseline: 2.45 ft/sec ?Goal status: INITIAL ?  ?3.  Pt will improve 5x STS to </= 25 sec to demo improved functional LE strength and balance  ?Baseline: 29.82 ?Goal status: INITIAL ?  ?4.  Pt will report 25% improvement in hip pain in functional activities  ?Baseline: pain limiting activities ?Goal status: INITIAL ?  ?5. Pt will improve Berg Balance to >/= 44/56 to demonstrate improved standing balance and reduced fall risk ?Baseline: 40/56 ?Goal status: INITIAL ?  ?  ?LONG TERM GOALS: Target date: 03/30/2022 ?  ?Pt will be independent with final progression land and aquatic HEP for improved strength/balance  ?Baseline: no HEP established ?Goal status: INITIAL ?  ?2.  Pt will improve 5x STS to </= 20 sec to demo improved functional LE strength and balance  ?Baseline: 29.82 ?Goal status: INITIAL ?  ?3.  Pt will improve gait speed to >/= 2.9 ft/sec to demonstrate improved community ambulation ?Baseline: 2.45 ft/sec ?Goal status: INITIAL ?  ?4.  Pt will be able to ascend/descend stairs with reciprocal pattern and single rail without pain at Mod I level ?Baseline: supervision/CGA due to unsteadiness/jerkiness ?Goal status: INITIAL ?  ?5.  Pt will improve Berg Balance to >/= 48/56 to demonstrate improved standing balance and reduced fall risk ?Baseline: 40/56 ?Goal status: INITIAL ?  ?6.  Pt will reports 50% improvement in hip pain with functional activities and driving ?Baseline: limiting functional activities.  ?Goal status: INITIAL ?  ?ASSESSMENT: ?  ?CLINICAL IMPRESSION: ?Today's skilled session focused on addition of balance ex's to HEP . No issues noted or reported with performance in session today. The pt is progressing and should benefit from continued PT to progress toward unmet goals.  ?  ?  ?OBJECTIVE  IMPAIRMENTS Abnormal gait,  decreased activity tolerance, decreased balance, decreased coordination, decreased endurance, difficulty walking, decreased strength, increased muscle spasms, impaired sensation, and pain.  ?  ?ACTIVITY LIMITATIONS cleaning, community activity, driving, meal prep, and laundry.  ?  ?PERSONAL FACTORS Time since onset of injury/illness/exacerbation and 3+ comorbidities: RA, L1 Vertebral Fx, Lupus, Osteopenia, Seizure, Chiari malformation s/p posterior decompression and VP shunt, myoclonic seizures s/p VNS/VNS removal in 2013, functional ataxia, and chronic headaches, Psychogenic Movement Disorder  are also affecting patient's functional outcome.  ?  ?  ?REHAB POTENTIAL: Good ?  ?CLINICAL DECISION MAKING: Evolving/moderate complexity ?  ?EVALUATION COMPLEXITY: Moderate ?  ?PLAN: ?PT FREQUENCY: 2x/week ?  ?PT DURATION: 8 weeks ?  ?PLANNED INTERVENTIONS: Therapeutic exercises, Therapeutic activity, Neuromuscular re-education, Balance training, Gait training, Patient/Family education, Joint manipulation, Joint mobilization, Stair training, Vestibular training, DME instructions, Aquatic Therapy, Dry Needling, Cryotherapy, Moist heat, and Manual therapy ?  ?PLAN FOR NEXT SESSION:  ?     Continued to work on strengthening and balance ex's; work on coordination and balance- tall kneeling, quadruped activities ? ? ? ? ? ? ?Willow Ora, PTA, CLT ?[address], [address] ?[place], [place] [address] ?[phone] ?02/15/22, 2:31 PM  ? ?   ?

## 2022-02-19 ENCOUNTER — Encounter: Payer: Self-pay | Admitting: Physical Therapy

## 2022-02-19 ENCOUNTER — Ambulatory Visit: Payer: Medicare HMO | Attending: Family Medicine | Admitting: Physical Therapy

## 2022-02-19 DIAGNOSIS — R2689 Other abnormalities of gait and mobility: Secondary | ICD-10-CM | POA: Diagnosis not present

## 2022-02-19 DIAGNOSIS — R262 Difficulty in walking, not elsewhere classified: Secondary | ICD-10-CM | POA: Diagnosis not present

## 2022-02-19 DIAGNOSIS — M6281 Muscle weakness (generalized): Secondary | ICD-10-CM | POA: Insufficient documentation

## 2022-02-19 DIAGNOSIS — R278 Other lack of coordination: Secondary | ICD-10-CM | POA: Insufficient documentation

## 2022-02-19 DIAGNOSIS — R2681 Unsteadiness on feet: Secondary | ICD-10-CM | POA: Insufficient documentation

## 2022-02-19 NOTE — Therapy (Signed)
?OUTPATIENT PHYSICAL THERAPY TREATMENT NOTE ? ? ?Patient Name: Robin Paul ?MRN: [id number] ?DOB:1971-04-23, 51 y.o., female ?Today's Date: 02/19/2022 ? ?PCP: Daisy Floro, MD ?REFERRING PROVIDER: Van Clines, MD  ? ? PT End of Session - 02/19/22 1956   ? ? Visit Number 5   ? Number of Visits 17   ? Date for PT Re-Evaluation 03/30/22   ? Authorization Type [employer]   ? Authorization Time Period 17 visits from 02/02/22- 03/30/22   ? Authorization - Visit Number 5   ? Authorization - Number of Visits 17   ? Progress Note Due on Visit 10   ? PT Start Time 1310   ? PT Stop Time 1355   ? PT Time Calculation (min) 45 min   ? Equipment Utilized During Treatment Other (comment)   pool noodle, aquatic ankle cuffs, aquatic pillow, single bar bells  ? Activity Tolerance Patient tolerated treatment well   ? Behavior During Therapy [facility] for tasks assessed/performed   ? ?  ?  ? ?  ? ? ?Past Medical History:  ?Diagnosis Date  ? Arthritis, rheumatoid (HCC)   ? L1 vertebral fracture (HCC)   ? Lupus (HCC)   ? Osteopenia   ? Seizure [facility])   ? 1998  ? ?Past Surgical History:  ?Procedure Laterality Date  ? SPINAL CORD STIMULATOR IMPLANT    ? ?There are no problems to display for this patient. ? ? ?REFERRING DIAG: M79.604 (ICD-10-CM) - Right leg pain  ? ?THERAPY DIAG:  ?Difficulty in walking, not elsewhere classified ? ?Other abnormalities of gait and mobility ? ?Unsteadiness on feet ? ?Muscle weakness (generalized) ? ?PERTINENT HISTORY: RA, L1 Vertebral Fx, Lupus, Osteopenia, Seizure, Chiari malformation s/p posterior decompression and VP shunt, myoclonic seizures s/p VNS/VNS removal in 2013, functional ataxia, and chronic headaches, Psychogenic Movement Disorder.  ? ?PRECAUTIONS: RA, L1 Vertebral Fx, Lupus, Osteopenia, Seizure, Chiari malformation s/p posterior decompression and VP shunt, myoclonic seizures s/p VNS/VNS removal in 2013, functional ataxia, and chronic headaches, Psychogenic Movement Disorder.   ? ?SUBJECTIVE: No new complaints. Had a relaxing weekend and slept a lot. Did increase her stimulator to go further up her back with improvement pain noted this morning.   ? ?PAIN:  ?Are you having pain? Yes ?NPRS scale: 5/10 ?Pain location: generalized due to RA, mostly in hips today ?Pain orientation: Other: back and joints   ?PAIN TYPE: Chronic ?Pain description: constant, aching, and sharp nerve pain a times   ?Aggravating factors: increased activity,and prolonged immobility ?Relieving factors: medication, spinal stimulator, lidocaine patches as needed, heat at times  ? ?                                                                                                                                                                       ?  ?  ?  ?  TODAY'S TREATMENT: ?  02/19/2022 ?Aquatic therapy at [facility] - pool temp 92 degrees ? ? ? Patient seen for aquatic therapy today.  Treatment took place in water 3.5-4.5 feet deep depending upon activity.  Pt entered/exited pool via stairs with bil rails, step to pattern, min guard assist for safety.  ? ? ? llow noodle for walking/dynamic balance with marching,tandem gait ?single bar bells for static balance ?wiht aqutic ankle wraps at pool edge for abd, ext adn hs curls ?floating with aqutic pillow/noodls=hip abd/add, scoissors, bicyle ? ? ? ? ? ?Pt requires buoyancy of water for support for reduced fall risk with gait training and balance exercises with minimal UE support; exercises able to be performed safely in water without the risk of fall compared to those same exercises performed on land;  viscosity of water needed for resistance for strengthening.  Current of water provides perturbations for challenging static & dynamic standing balance.    ?  ?   ? ?  ?  ?  ?PATIENT EDUCATION: ?Education details:continue with current HEP ?Person educated: Patient ?Education method: Explanation ?Education comprehension: verbalized understanding ?  ?  ?HOME EXERCISE  PROGRAM: ?Access Code: [email or web address] ?URL: [email or web address] ?Date: 02/15/2022 ?Prepared by: Sallyanne Kuster ? ?Exercises ?- Seated Hamstring Stretch  - 1 x daily - 5 x weekly - 1 sets - 3 reps - 30 hold ?- Sit to Stand with Counter Support  - 1 x daily - 5 x weekly - 1 sets - 8-10 reps ?- Standing Balance in Corner with Eyes Closed  - 1 x daily - 5 x weekly - 1 sets - 3 reps - 30 seconds hold ?- Standing Near Stance in Corner  - 1 x daily - 5 x weekly - 1 sets - 10 reps ?- Wide Tandem Stance with Eyes Closed  - 1 x daily - 5 x weekly - 1 sets - 2 reps - 15 seconds hold ?- Tandem Walking with Counter Support  - 1 x daily - 5 x weekly - 1 sets - 3 reps ?- Walking March  - 1 x daily - 5 x weekly - 1 sets - 3 reps ? ?  ?GOALS: ? ?  ?SHORT TERM GOALS: Target date: 03/02/2022 ?  ?Pt will be independent with initial HEP for improved balance/strength/stretching  ?Baseline: no HEP established ?Goal status: INITIAL ?  ?2.  Pt will improve gait speed to >/= 2.62 ft/sec to demonstrate improved community ambulation ?Baseline: 2.45 ft/sec ?Goal status: INITIAL ?  ?3.  Pt will improve 5x STS to </= 25 sec to demo improved functional LE strength and balance  ?Baseline: 29.82 ?Goal status: INITIAL ?  ?4.  Pt will report 25% improvement in hip pain in functional activities  ?Baseline: pain limiting activities ?Goal status: INITIAL ?  ?5. Pt will improve Berg Balance to >/= 44/56 to demonstrate improved standing balance and reduced fall risk ?Baseline: 40/56 ?Goal status: INITIAL ?  ?  ?LONG TERM GOALS: Target date: 03/30/2022 ?  ?Pt will be independent with final progression land and aquatic HEP for improved strength/balance  ?Baseline: no HEP established ?Goal status: INITIAL ?  ?2.  Pt will improve 5x STS to </= 20 sec to demo improved functional LE strength and balance  ?Baseline: 29.82 ?Goal status: INITIAL ?  ?3.  Pt will improve gait speed to >/= 2.9 ft/sec to demonstrate improved community ambulation ?Baseline: 2.45  ft/sec ?Goal status: INITIAL ?  ?4.  Pt will be able to ascend/descend stairs with reciprocal  pattern and single rail without pain at Mod I level ?Baseline: supervision/CGA due to unsteadiness/jerkiness ?Goal status: INITIAL ?  ?5.  Pt will improve Berg Balance to >/= 48/56 to demonstrate improved standing balance and reduced fall risk ?Baseline: 40/56 ?Goal status: INITIAL ?  ?6.  Pt will reports 50% improvement in hip pain with functional activities and driving ?Baseline: limiting functional activities.  ?Goal status: INITIAL ?  ?ASSESSMENT: ?  ?CLINICAL IMPRESSION: ?Today's skilled session continued to address gait, strengthening and balance in the aquatic setting with no issues noted or reported. ?  ?  ?OBJECTIVE IMPAIRMENTS Abnormal gait, decreased activity tolerance, decreased balance, decreased coordination, decreased endurance, difficulty walking, decreased strength, increased muscle spasms, impaired sensation, and pain.  ?  ?ACTIVITY LIMITATIONS cleaning, community activity, driving, meal prep, and laundry.  ?  ?PERSONAL FACTORS Time since onset of injury/illness/exacerbation and 3+ comorbidities: RA, L1 Vertebral Fx, Lupus, Osteopenia, Seizure, Chiari malformation s/p posterior decompression and VP shunt, myoclonic seizures s/p VNS/VNS removal in 2013, functional ataxia, and chronic headaches, Psychogenic Movement Disorder  are also affecting patient's functional outcome.  ?  ?  ?REHAB POTENTIAL: Good ?  ?CLINICAL DECISION MAKING: Evolving/moderate complexity ?  ?EVALUATION COMPLEXITY: Moderate ?  ?PLAN: ?PT FREQUENCY: 2x/week ?  ?PT DURATION: 8 weeks ?  ?PLANNED INTERVENTIONS: Therapeutic exercises, Therapeutic activity, Neuromuscular re-education, Balance training, Gait training, Patient/Family education, Joint manipulation, Joint mobilization, Stair training, Vestibular training, DME instructions, Aquatic Therapy, Dry Needling, Cryotherapy, Moist heat, and Manual therapy ?  ?PLAN FOR NEXT SESSION:  ?      Continued to work on strengthening and balance ex's; work on coordination and balance- tall kneeling, quadruped activities ? ? ? ? ? ? ?Sallyanne KusterKathy Ashkan Chamberland, PTA, CLT ?[facility] ?[address], [address] ?[place]

## 2022-02-20 ENCOUNTER — Ambulatory Visit: Payer: Medicare HMO

## 2022-02-21 DIAGNOSIS — Z9889 Other specified postprocedural states: Secondary | ICD-10-CM | POA: Diagnosis not present

## 2022-02-21 DIAGNOSIS — Z124 Encounter for screening for malignant neoplasm of cervix: Secondary | ICD-10-CM | POA: Diagnosis not present

## 2022-02-21 DIAGNOSIS — Z01419 Encounter for gynecological examination (general) (routine) without abnormal findings: Secondary | ICD-10-CM | POA: Diagnosis not present

## 2022-02-22 ENCOUNTER — Encounter: Payer: Self-pay | Admitting: Physical Therapy

## 2022-02-22 ENCOUNTER — Ambulatory Visit: Payer: Medicare HMO | Admitting: Physical Therapy

## 2022-02-22 DIAGNOSIS — R2689 Other abnormalities of gait and mobility: Secondary | ICD-10-CM

## 2022-02-22 DIAGNOSIS — R262 Difficulty in walking, not elsewhere classified: Secondary | ICD-10-CM

## 2022-02-22 DIAGNOSIS — R278 Other lack of coordination: Secondary | ICD-10-CM | POA: Diagnosis not present

## 2022-02-22 DIAGNOSIS — R2681 Unsteadiness on feet: Secondary | ICD-10-CM | POA: Diagnosis not present

## 2022-02-22 DIAGNOSIS — M6281 Muscle weakness (generalized): Secondary | ICD-10-CM

## 2022-02-22 NOTE — Therapy (Signed)
?OUTPATIENT PHYSICAL THERAPY TREATMENT NOTE ? ? ?Patient Name: Robin Paul ?MRN: [id number] ?DOB:[birth year], 51 y.o., female ?Today's Date: 02/22/2022 ? ?PCP: Lawerance Cruel, MD ?REFERRING PROVIDER: Cameron Sprang, MD  ? ? PT End of Session - 02/22/22 1104   ? ? Visit Number 6   ? Number of Visits 17   ? Date for PT Re-Evaluation 03/30/22   ? Authorization Type [employer]   ? Authorization Time Period 17 visits from 02/02/22- 03/30/22   ? Authorization - Visit Number 6   ? Authorization - Number of Visits 17   ? Progress Note Due on Visit 10   ? PT Start Time 1103   ? PT Stop Time 1145   ? PT Time Calculation (min) 42 min   ? Equipment Utilized During Treatment Gait belt   ? Activity Tolerance Patient tolerated treatment well   ? Behavior During Therapy [facility] for tasks assessed/performed   ? ?  ?  ? ?  ? ? ?Past Medical History:  ?Diagnosis Date  ? Arthritis, rheumatoid ([place])   ? L1 vertebral fracture (HCC)   ? Lupus ([place])   ? Osteopenia   ? Seizure [facility])   ? 1998  ? ?Past Surgical History:  ?Procedure Laterality Date  ? SPINAL CORD STIMULATOR IMPLANT    ? ?There are no problems to display for this patient. ? ? ?REFERRING DIAG: M79.604 (ICD-10-CM) - Right leg pain  ? ?THERAPY DIAG:  ?Difficulty in walking, not elsewhere classified ? ?Other abnormalities of gait and mobility ? ?Unsteadiness on feet ? ?Muscle weakness (generalized) ? ?PERTINENT HISTORY: RA, L1 Vertebral Fx, Lupus, Osteopenia, Seizure, Chiari malformation s/p posterior decompression and VP shunt, myoclonic seizures s/p VNS/VNS removal in 2013, functional ataxia, and chronic headaches, Psychogenic Movement Disorder.  ? ?PRECAUTIONS: RA, L1 Vertebral Fx, Lupus, Osteopenia, Seizure, Chiari malformation s/p posterior decompression and VP shunt, myoclonic seizures s/p VNS/VNS removal in 2013, functional ataxia, and chronic headaches, Psychogenic Movement Disorder.  ? ?SUBJECTIVE: No new complaints. Felt good after the pool Monday. Continues with  swelling at lateral right knee. Has not tried to ice it.   ? ?PAIN:  ?Are you having pain? Yes ?NPRS scale: 3-4/10 ?Pain location: generalized due to RA, mostly in hips today ?Pain orientation: Other: back and joints   ?PAIN TYPE: Chronic ?Pain description: constant, aching, and sharp nerve pain a times   ?Aggravating factors: increased activity,and prolonged immobility ?Relieving factors: medication, spinal stimulator, lidocaine patches as needed, heat at times  ? ?                                                                                                                                                                       ?  ?  ?  ?  TODAY'S TREATMENT: ?  02/22/2022 ? MANUAL THERAPY: ? Ice massage to right lateral aspect of knee x 4-5 minutes for swelling/pain. Pt educated on how to do this at home.  ? ? STRENGTHENING  ? Scifit UE/LE's level 3.5 x 8 minutes with goal >/= 50 steps per minute for strengthening and activity tolerance.  ? ? BALANCE/NMR: ?Rockerboard: performed both ways on balance board with no UE support/occasional touch to bars- rocking the board with EO, progressing to EC with min guard to min assist. Then holding the board steady for EC 30 seconds x 3 reps. Min guard to min assist for balance. Brief rest breaks needed due to LE tremors at times on rocker board with no buckling noted.    ?  ?   ? ?  ?  ?  ?PATIENT EDUCATION: ?Education details:continue with current HEP ?Person educated: Patient ?Education method: Explanation ?Education comprehension: verbalized understanding ?  ?  ?HOME EXERCISE PROGRAM: ?Access Code: [email or web address] ?URL: [email or web address] ?Date: 02/15/2022 ?Prepared by: Willow Ora ? ?Exercises ?- Seated Hamstring Stretch  - 1 x daily - 5 x weekly - 1 sets - 3 reps - 30 hold ?- Sit to Stand with Counter Support  - 1 x daily - 5 x weekly - 1 sets - 8-10 reps ?- Standing Balance in Corner with Eyes Closed  - 1 x daily - 5 x weekly - 1 sets - 3 reps - 30 seconds hold ?-  Standing Near Stance in Corner  - 1 x daily - 5 x weekly - 1 sets - 10 reps ?- Wide Tandem Stance with Eyes Closed  - 1 x daily - 5 x weekly - 1 sets - 2 reps - 15 seconds hold ?- Tandem Walking with Counter Support  - 1 x daily - 5 x weekly - 1 sets - 3 reps ?- Walking March  - 1 x daily - 5 x weekly - 1 sets - 3 reps ? ?  ?GOALS: ? ?  ?SHORT TERM GOALS: Target date: 03/02/2022 ?  ?Pt will be independent with initial HEP for improved balance/strength/stretching  ?Baseline: no HEP established ?Goal status: INITIAL ?  ?2.  Pt will improve gait speed to >/= 2.62 ft/sec to demonstrate improved community ambulation ?Baseline: 2.45 ft/sec ?Goal status: INITIAL ?  ?3.  Pt will improve 5x STS to </= 25 sec to demo improved functional LE strength and balance  ?Baseline: 29.82 ?Goal status: INITIAL ?  ?4.  Pt will report 25% improvement in hip pain in functional activities  ?Baseline: pain limiting activities ?Goal status: INITIAL ?  ?5. Pt will improve Berg Balance to >/= 44/56 to demonstrate improved standing balance and reduced fall risk ?Baseline: 40/56 ?Goal status: INITIAL ?  ?  ?LONG TERM GOALS: Target date: 03/30/2022 ?  ?Pt will be independent with final progression land and aquatic HEP for improved strength/balance  ?Baseline: no HEP established ?Goal status: INITIAL ?  ?2.  Pt will improve 5x STS to </= 20 sec to demo improved functional LE strength and balance  ?Baseline: 29.82 ?Goal status: INITIAL ?  ?3.  Pt will improve gait speed to >/= 2.9 ft/sec to demonstrate improved community ambulation ?Baseline: 2.45 ft/sec ?Goal status: INITIAL ?  ?4.  Pt will be able to ascend/descend stairs with reciprocal pattern and single rail without pain at Mod I level ?Baseline: supervision/CGA due to unsteadiness/jerkiness ?Goal status: INITIAL ?  ?5.  Pt will improve Berg Balance to >/= 48/56 to demonstrate improved standing balance and  reduced fall risk ?Baseline: 40/56 ?Goal status: INITIAL ?  ?6.  Pt will reports 50%  improvement in hip pain with functional activities and driving ?Baseline: limiting functional activities.  ?Goal status: INITIAL ?  ?ASSESSMENT: ?  ?CLINICAL IMPRESSION: ?Today's skilled session continued to focus on strengthening and balance with no issues noted or reported in session. The pt is making steady progress and should benefit from continued PT to progress toward unmet goals.  ?  ?  ?OBJECTIVE IMPAIRMENTS Abnormal gait, decreased activity tolerance, decreased balance, decreased coordination, decreased endurance, difficulty walking, decreased strength, increased muscle spasms, impaired sensation, and pain.  ?  ?ACTIVITY LIMITATIONS cleaning, community activity, driving, meal prep, and laundry.  ?  ?PERSONAL FACTORS Time since onset of injury/illness/exacerbation and 3+ comorbidities: RA, L1 Vertebral Fx, Lupus, Osteopenia, Seizure, Chiari malformation s/p posterior decompression and VP shunt, myoclonic seizures s/p VNS/VNS removal in 2013, functional ataxia, and chronic headaches, Psychogenic Movement Disorder  are also affecting patient's functional outcome.  ?  ?  ?REHAB POTENTIAL: Good ?  ?CLINICAL DECISION MAKING: Evolving/moderate complexity ?  ?EVALUATION COMPLEXITY: Moderate ?  ?PLAN: ?PT FREQUENCY: 2x/week ?  ?PT DURATION: 8 weeks ?  ?PLANNED INTERVENTIONS: Therapeutic exercises, Therapeutic activity, Neuromuscular re-education, Balance training, Gait training, Patient/Family education, Joint manipulation, Joint mobilization, Stair training, Vestibular training, DME instructions, Aquatic Therapy, Dry Needling, Cryotherapy, Moist heat, and Manual therapy ?  ?PLAN FOR NEXT SESSION:  ?     Continued to work on strengthening and balance ex's; work on coordination and balance- tall kneeling, quadruped activities, compliant surfaces with and without vision removed ? ? ? ? ? ? ?Willow Ora, PTA, CLT ?[address], [address] ?[place], [place] [address] ?[phone] ?02/22/22,  4:46 PM  ? ?   ?

## 2022-02-28 ENCOUNTER — Ambulatory Visit: Payer: Medicare HMO

## 2022-02-28 DIAGNOSIS — M6281 Muscle weakness (generalized): Secondary | ICD-10-CM | POA: Diagnosis not present

## 2022-02-28 DIAGNOSIS — R278 Other lack of coordination: Secondary | ICD-10-CM

## 2022-02-28 DIAGNOSIS — R262 Difficulty in walking, not elsewhere classified: Secondary | ICD-10-CM

## 2022-02-28 DIAGNOSIS — R2689 Other abnormalities of gait and mobility: Secondary | ICD-10-CM

## 2022-02-28 DIAGNOSIS — R2681 Unsteadiness on feet: Secondary | ICD-10-CM

## 2022-02-28 NOTE — Therapy (Signed)
?OUTPATIENT PHYSICAL THERAPY TREATMENT NOTE ? ? ?Patient Name: Robin Paul ?MRN: [id number] ?DOB:[birth year], 51 y.o., female ?Today's Date: 02/28/2022 ? ?PCP: Lawerance Cruel, MD ?REFERRING PROVIDER: Cameron Sprang, MD  ? ? PT End of Session - 02/28/22 0753   ? ? Visit Number 7   ? Number of Visits 17   ? Date for PT Re-Evaluation 03/30/22   ? Authorization Type [employer]   ? Authorization Time Period 17 visits from 02/02/22- 03/30/22   ? Authorization - Visit Number 7   ? Authorization - Number of Visits 17   ? Progress Note Due on Visit 10   ? PT Start Time 0800   ? PT Stop Time [phone]   ? PT Time Calculation (min) 43 min   ? Equipment Utilized During Treatment Gait belt   ? Activity Tolerance Patient tolerated treatment well   ? Behavior During Therapy [facility] for tasks assessed/performed   ? ?  ?  ? ?  ? ? ?Past Medical History:  ?Diagnosis Date  ? Arthritis, rheumatoid ([place])   ? L1 vertebral fracture (HCC)   ? Lupus ([place])   ? Osteopenia   ? Seizure [facility])   ? 1998  ? ?Past Surgical History:  ?Procedure Laterality Date  ? SPINAL CORD STIMULATOR IMPLANT    ? ?There are no problems to display for this patient. ? ? ?REFERRING DIAG: M79.604 (ICD-10-CM) - Right leg pain  ? ?THERAPY DIAG:  ?Difficulty in walking, not elsewhere classified ? ?Other abnormalities of gait and mobility ? ?Unsteadiness on feet ? ?Other lack of coordination ? ?Muscle weakness (generalized) ? ?PERTINENT HISTORY: RA, L1 Vertebral Fx, Lupus, Osteopenia, Seizure, Chiari malformation s/p posterior decompression and VP shunt, myoclonic seizures s/p VNS/VNS removal in 2013, functional ataxia, and chronic headaches, Psychogenic Movement Disorder.  ? ?PRECAUTIONS: RA, L1 Vertebral Fx, Lupus, Osteopenia, Seizure, Chiari malformation s/p posterior decompression and VP shunt, myoclonic seizures s/p VNS/VNS removal in 2013, functional ataxia, and chronic headaches, Psychogenic Movement Disorder.  ? ?SUBJECTIVE: Patient reports felt good over the  weekend, yesterday her knee went inwards and lead to some swelling in the knee. Patient reports some swelling on the R Knee.  ? ?PAIN:  ?Are you having pain? Yes ?NPRS scale: 2/10 ?Pain location: R Knee (Lateral) ?Pain orientation: Other: R Knee   ?PAIN TYPE: Chronic ?Pain description: sharp  ?Aggravating factors: increased activity,and prolonged immobility ?Relieving factors: medication, spinal stimulator, lidocaine patches as needed, heat at times ? ?                                                                                                                                                                       ?  ?  ?  ?TODAY'S TREATMENT: ?SELF  CARE: ? PT assessed R Knee with patient demonstrating some mild swelling on medial and lateral joint line. PT continued to educate on ice application and elevating.  ? ? ?GAIT: ?Gait pattern: step through pattern, decreased step length- Right, decreased step length- Left, decreased stance time- Right, decreased hip/knee flexion- Right, and ataxic ?Distance walked: throughout clinic with assessment ?Assistive device utilized: None ?Level of assistance: Supervision/CGA ?Gait Speed = 11.97 secs = 2.74 ft/sec ? ?Outcome Measures ?5x Sit to Stand:  21.00  secs without UE support, secs, 26.72 secs without UE support(more LE shakiness legs) ?  ? [facility] PT Assessment - 02/28/22 0001   ? ?  ? Berg Balance Test  ? Sit to Stand Able to stand without using hands and stabilize independently   ? Standing Unsupported Able to stand safely 2 minutes   ? Sitting with Back Unsupported but Feet Supported on Floor or Stool Able to sit safely and securely 2 minutes   ? Stand to Sit Controls descent by using hands   ? Transfers Able to transfer safely, minor use of hands   ? Standing Unsupported with Eyes Closed Able to stand 10 seconds with supervision   ? Standing Unsupported with Feet Together Able to place feet together independently and stand for 1 minute with supervision   ? From  Standing, Reach Forward with Outstretched Arm Can reach forward >12 cm safely (5")   9"  ? From Standing Position, Pick up Object from Floor Able to pick up shoe, needs supervision   ? From Standing Position, Turn to Look Behind Over each Shoulder Looks behind from both sides and weight shifts well   ? Turn 360 Degrees Able to turn 360 degrees safely in 4 seconds or less   ? Standing Unsupported, Alternately Place Feet on Step/Stool Able to stand independently and safely and complete 8 steps in 20 seconds   17.78 secs  ? Standing Unsupported, One Foot in Front Able to plae foot ahead of the other independently and hold 30 seconds   ? Standing on One Leg Able to lift leg independently and hold equal to or more than 3 seconds   4.2 secs  ? Total Score 48   ? Merrilee Jansky comment: 48/56   ?  ? Functional Gait  Assessment  ? Gait assessed  Yes   ? Gait Level Surface Walks 20 ft in less than 7 sec but greater than 5.5 sec, uses assistive device, slower speed, mild gait deviations, or deviates 6-10 in outside of the 12 in walkway width.   6.98 secs  ? Change in Gait Speed Able to change speed, demonstrates mild gait deviations, deviates 6-10 in outside of the 12 in walkway width, or no gait deviations, unable to achieve a Shelp change in velocity, or uses a change in velocity, or uses an assistive device.   ? Gait with Horizontal Head Turns Performs head turns smoothly with slight change in gait velocity (eg, minor disruption to smooth gait path), deviates 6-10 in outside 12 in walkway width, or uses an assistive device.   ? Gait with Vertical Head Turns Performs task with moderate change in gait velocity, slows down, deviates 10-15 in outside 12 in walkway width but recovers, can continue to walk.   ? Gait and Pivot Turn Turns slowly, requires verbal cueing, or requires several small steps to catch balance following turn and stop   ? Step Over Obstacle Is able to step over one shoe box (4.5 in total height)  without changing gait  speed. No evidence of imbalance.   ? Gait with Narrow Base of Support Ambulates 4-7 steps.   ? Gait with Eyes Closed Walks 20 ft, slow speed, abnormal gait pattern, evidence for imbalance, deviates 10-15 in outside 12 in walkway width. Requires more than 9 sec to ambulate 20 ft.   ? Ambulating Backwards Walks 20 ft, uses assistive device, slower speed, mild gait deviations, deviates 6-10 in outside 12 in walkway width.   ? Steps Two feet to a stair, must use rail.   ? Total Score 15   ? FGA comment: 15/30   ? ?  ?  ? ?  ? ? ? ?  ?  ?  ?PATIENT EDUCATION: ?Education details: progress toward STGs ?Person educated: Patient ?Education method: Explanation ?Education comprehension: verbalized understanding ?  ?  ?HOME EXERCISE PROGRAM: ?Access Code: [email or web address] ? ?  ?GOALS: ? ?  ?SHORT TERM GOALS: Target date: 03/02/2022 ?  ?Pt will be independent with initial HEP for improved balance/strength/stretching  ?Baseline: no HEP established; reports independence with current HEP ?Goal status: MET ?  ?2.  Pt will improve gait speed to >/= 2.62 ft/sec to demonstrate improved community ambulation ?Baseline: 2.45 ft/sec; 2.74 ft/sec ?Goal status:  MET ?  ?3.  Pt will improve 5x STS to </= 25 sec to demo improved functional LE strength and balance  ?Baseline: 29.82;  21.00  secs with UE support, secs, 26.72 secs without UE support(more LE shakiness legs) ?Goal status: MET ?  ?4.  Pt will report 25% improvement in hip pain in functional activities  ?Baseline: pain limiting activities; patient reporting approx 35% improvement in hip pain with activities ?Goal status: MET ?  ?5. Pt will improve Berg Balance to >/= 44/56 to demonstrate improved standing balance and reduced fall risk ?Baseline: 40/56; 48/56 ?Goal status: MET ?  ?  ?LONG TERM GOALS: Target date: 03/30/2022 ?  ?Pt will be independent with final progression land and aquatic HEP for improved strength/balance  ?Baseline: no HEP established ?Goal status: INITIAL ?  ?2.  Pt will  improve 5x STS to </= 20 sec to demo improved functional LE strength and balance  ?Baseline: 29.82 ?Goal status: INITIAL ?  ?3.  Pt will improve gait speed to >/= 2.9 ft/sec to demonstrate improved community ambulati

## 2022-03-05 ENCOUNTER — Ambulatory Visit: Payer: Medicare HMO | Admitting: Physical Therapy

## 2022-03-06 ENCOUNTER — Ambulatory Visit: Payer: Medicare HMO

## 2022-03-08 ENCOUNTER — Ambulatory Visit: Payer: Medicare HMO

## 2022-03-08 DIAGNOSIS — R262 Difficulty in walking, not elsewhere classified: Secondary | ICD-10-CM | POA: Diagnosis not present

## 2022-03-08 DIAGNOSIS — R2681 Unsteadiness on feet: Secondary | ICD-10-CM

## 2022-03-08 DIAGNOSIS — R278 Other lack of coordination: Secondary | ICD-10-CM | POA: Diagnosis not present

## 2022-03-08 DIAGNOSIS — R2689 Other abnormalities of gait and mobility: Secondary | ICD-10-CM | POA: Diagnosis not present

## 2022-03-08 DIAGNOSIS — M6281 Muscle weakness (generalized): Secondary | ICD-10-CM

## 2022-03-08 NOTE — Therapy (Signed)
?OUTPATIENT PHYSICAL THERAPY TREATMENT NOTE ? ? ?Patient Name: Robin Paul ?MRN: [id number] ?DOB:1970/12/29, 50 y.o., female ?Today's Date: 03/08/2022 ? ?PCP: Lawerance Cruel, MD ?REFERRING PROVIDER: Cameron Sprang, MD  ? ? PT End of Session - 03/08/22 1102   ? ? Visit Number 8   ? Number of Visits 17   ? Date for PT Re-Evaluation 03/30/22   ? Authorization Type [employer]   ? Authorization Time Period 17 visits from 02/02/22- 03/30/22   ? Authorization - Visit Number 8   ? Authorization - Number of Visits 17   ? Progress Note Due on Visit 10   ? PT Start Time 1101   ? PT Stop Time 1145   ? PT Time Calculation (min) 44 min   ? Equipment Utilized During Treatment Gait belt   ? Activity Tolerance Patient tolerated treatment well   ? Behavior During Therapy [facility] for tasks assessed/performed   ? ?  ?  ? ?  ? ? ?Past Medical History:  ?Diagnosis Date  ? Arthritis, rheumatoid ([place])   ? L1 vertebral fracture (HCC)   ? Lupus ([place])   ? Osteopenia   ? Seizure [facility])   ? 1998  ? ?Past Surgical History:  ?Procedure Laterality Date  ? SPINAL CORD STIMULATOR IMPLANT    ? ?There are no problems to display for this patient. ? ? ?REFERRING DIAG: M79.604 (ICD-10-CM) - Right leg pain  ? ?THERAPY DIAG:  ?Other abnormalities of gait and mobility ? ?Muscle weakness (generalized) ? ?Unsteadiness on feet ? ?PERTINENT HISTORY: RA, L1 Vertebral Fx, Lupus, Osteopenia, Seizure, Chiari malformation s/p posterior decompression and VP shunt, myoclonic seizures s/p VNS/VNS removal in 2013, functional ataxia, and chronic headaches, Psychogenic Movement Disorder.  ? ?PRECAUTIONS: RA, L1 Vertebral Fx, Lupus, Osteopenia, Seizure, Chiari malformation s/p posterior decompression and VP shunt, myoclonic seizures s/p VNS/VNS removal in 2013, functional ataxia, and chronic headaches, Psychogenic Movement Disorder.  ? ?SUBJECTIVE: Pt reports she is doing well. She is getting ready to move. ? ?PAIN:  ?Are you having pain? no ?                                                                                                                                                                        ?  ?  ?  ?TODAY'S TREATMENT: ? ?In // bars: Standing on airex- feet apart eyes open x 30 sec, eyes closed 30 sec x 2 with increased sway, feet together x 30 sec, head turns left/right x 10 and up/down x 10, alternating taps on cone with fingertip support x 10. ?Standing on rockerboard positioned ant/post: maintaining level eyes open x 30 sec, eyes closed x 30 sec with losing balance posterior at times CGA/min assist,  rocking board ant/post x 10. ?Standing on rockerboard positioned lateral: maintaining level eyes open x 30 sec, eyes closed x 30 sec with occasional min assist, rocking side to side x 10. ?Dynamic gait activities in hallway: gait with head turns left/right 40' x 6 with verbal cues to relax arms for more arm swing and try to increase step length. Pt initially scissoring some but improved as went on with wider BOS. Gait with stop/go on command. Always stopped with LLE in front and rotated some to the right. Close SBA/CGA with activities for safety. ? ? Sci Fit x 6 min level 3.6 with BUE and BLE for strengthening and improving activity tolerance.. Legs felt used after. ? ?  ?  ?  ?PATIENT EDUCATION: ? ?  ?  ?HOME EXERCISE PROGRAM: ?Access Code: [email or web address] ? ?  ?GOALS: ? ?  ?SHORT TERM GOALS: Target date: 03/02/2022 ?  ?Pt will be independent with initial HEP for improved balance/strength/stretching  ?Baseline: no HEP established; reports independence with current HEP ?Goal status: MET ?  ?2.  Pt will improve gait speed to >/= 2.62 ft/sec to demonstrate improved community ambulation ?Baseline: 2.45 ft/sec; 2.74 ft/sec ?Goal status:  MET ?  ?3.  Pt will improve 5x STS to </= 25 sec to demo improved functional LE strength and balance  ?Baseline: 29.82;  21.00  secs with UE support, secs, 26.72 secs without UE support(more LE shakiness legs) ?Goal status: MET ?  ?4.  Pt will  report 25% improvement in hip pain in functional activities  ?Baseline: pain limiting activities; patient reporting approx 35% improvement in hip pain with activities ?Goal status: MET ?  ?5. Pt will improve Berg Balance to >/= 44/56 to demonstrate improved standing balance and reduced fall risk ?Baseline: 40/56; 48/56 ?Goal status: MET ?  ?  ?LONG TERM GOALS: Target date: 03/30/2022 ?  ?Pt will be independent with final progression land and aquatic HEP for improved strength/balance  ?Baseline: no HEP established ?Goal status: INITIAL ?  ?2.  Pt will improve 5x STS to </= 20 sec to demo improved functional LE strength and balance  ?Baseline: 29.82 ?Goal status: INITIAL ?  ?3.  Pt will improve gait speed to >/= 2.9 ft/sec to demonstrate improved community ambulation ?Baseline: 2.45 ft/sec ?Goal status: INITIAL ?  ?4.  Pt will be able to ascend/descend stairs with reciprocal pattern and single rail without pain at Mod I level ?Baseline: supervision/CGA due to unsteadiness/jerkiness ?Goal status: INITIAL ?  ?5.  Pt will improve Berg Balance to >/= 48/56 to demonstrate improved standing balance and reduced fall risk ?Baseline: 40/56 ?Goal status: INITIAL ?  ?6.  Pt will reports 50% improvement in hip pain with functional activities and driving ?Baseline: limiting functional activities.  ?Goal status: INITIAL ?  ?ASSESSMENT: ?  ?CLINICAL IMPRESSION: ?Pt was challenged with static balance on compliant surfaces especially when vision removed. She has tendency to lose balance posterior. Pt not kicking in hip strategies much and relying on ankles. ?  ?OBJECTIVE IMPAIRMENTS Abnormal gait, decreased activity tolerance, decreased balance, decreased coordination, decreased endurance, difficulty walking, decreased strength, increased muscle spasms, impaired sensation, and pain.  ?  ?ACTIVITY LIMITATIONS cleaning, community activity, driving, meal prep, and laundry.  ?  ?PERSONAL FACTORS Time since onset of  injury/illness/exacerbation and 3+ comorbidities: RA, L1 Vertebral Fx, Lupus, Osteopenia, Seizure, Chiari malformation s/p posterior decompression and VP shunt, myoclonic seizures s/p VNS/VNS removal in 2013, functional ataxia, and chronic headaches, Psychogenic Movement Disorder  are also affecting patient's  functional outcome.  ?  ?  ?REHAB POTENTIAL: Good ?  ?CLINICAL DECISION MAKING: Evolving/moderate complexity ?  ?EVALUATION COMPLEXITY: Moderate ?  ?PLAN: ?PT FREQUENCY: 2x/week ?  ?PT DURATION: 8 weeks ?  ?PLANNED INTERVENTIONS: Therapeutic exercises, Therapeutic activity, Neuromuscular re-education, Balance training, Gait training, Patient/Family education, Joint manipulation, Joint mobilization, Stair training, Vestibular training, DME instructions, Aquatic Therapy, Dry Needling, Cryotherapy, Moist heat, and Manual therapy ?  ?PLAN FOR NEXT SESSION:  ?     Continued to work on strengthening and balance ex's; work on coordination and balance- tall kneeling, quadruped activities, compliant surfaces with and without vision removed. Begin to add in more functional gait assessment.  ? ? ? ? ? ? ?Cherly Anderson, PT, DPT, NCS ? ?[address] ?[address], [address] ?[place],  [address] ?[phone] ?03/08/22, 2:44 PM  ? ?   ?

## 2022-03-14 ENCOUNTER — Ambulatory Visit: Payer: Medicare HMO

## 2022-03-15 DIAGNOSIS — M0609 Rheumatoid arthritis without rheumatoid factor, multiple sites: Secondary | ICD-10-CM | POA: Diagnosis not present

## 2022-03-19 ENCOUNTER — Ambulatory Visit: Payer: Medicare HMO | Attending: Neurology | Admitting: Physical Therapy

## 2022-03-19 DIAGNOSIS — R262 Difficulty in walking, not elsewhere classified: Secondary | ICD-10-CM | POA: Insufficient documentation

## 2022-03-19 DIAGNOSIS — M6281 Muscle weakness (generalized): Secondary | ICD-10-CM | POA: Insufficient documentation

## 2022-03-19 DIAGNOSIS — R2681 Unsteadiness on feet: Secondary | ICD-10-CM | POA: Insufficient documentation

## 2022-03-19 DIAGNOSIS — R2689 Other abnormalities of gait and mobility: Secondary | ICD-10-CM | POA: Insufficient documentation

## 2022-03-19 DIAGNOSIS — R278 Other lack of coordination: Secondary | ICD-10-CM | POA: Insufficient documentation

## 2022-03-20 ENCOUNTER — Ambulatory Visit: Payer: Medicare HMO | Admitting: Physical Therapy

## 2022-03-22 ENCOUNTER — Ambulatory Visit: Payer: Medicare HMO | Admitting: Physical Therapy

## 2022-03-22 DIAGNOSIS — R2681 Unsteadiness on feet: Secondary | ICD-10-CM

## 2022-03-22 DIAGNOSIS — R2689 Other abnormalities of gait and mobility: Secondary | ICD-10-CM | POA: Diagnosis not present

## 2022-03-22 DIAGNOSIS — M6281 Muscle weakness (generalized): Secondary | ICD-10-CM | POA: Diagnosis not present

## 2022-03-22 DIAGNOSIS — R262 Difficulty in walking, not elsewhere classified: Secondary | ICD-10-CM | POA: Diagnosis not present

## 2022-03-22 DIAGNOSIS — R278 Other lack of coordination: Secondary | ICD-10-CM | POA: Diagnosis not present

## 2022-03-22 NOTE — Therapy (Signed)
?OUTPATIENT PHYSICAL THERAPY TREATMENT NOTE ? ? ?Patient Name: Robin Paul ?MRN: [id number] ?DOB:06/11/1971, 51 y.o., female ?Today's Date: 03/23/2022 ? ?PCP: Lawerance Cruel, MD ?REFERRING PROVIDER: Cameron Sprang, MD  ? ? PT End of Session - 03/23/22 0906   ? ? Visit Number 9   ? Number of Visits 17   ? Date for PT Re-Evaluation 03/30/22   ? Authorization Type [employer]   ? Authorization Time Period 17 visits from 02/02/22- 03/30/22   ? Authorization - Visit Number 9   ? Authorization - Number of Visits 17   ? Progress Note Due on Visit 10   ? PT Start Time 1103   ? PT Stop Time 1147   ? PT Time Calculation (min) 44 min   ? Equipment Utilized During Treatment Gait belt   ? Activity Tolerance Patient tolerated treatment well   ? Behavior During Therapy [facility] for tasks assessed/performed   ? ?  ?  ? ?  ? ? ? ?Past Medical History:  ?Diagnosis Date  ? Arthritis, rheumatoid ([place])   ? L1 vertebral fracture (HCC)   ? Lupus ([place])   ? Osteopenia   ? Seizure [facility])   ? 1998  ? ?Past Surgical History:  ?Procedure Laterality Date  ? SPINAL CORD STIMULATOR IMPLANT    ? ?There are no problems to display for this patient. ? ? ?REFERRING DIAG: M79.604 (ICD-10-CM) - Right leg pain  ? ?THERAPY DIAG:  ?Other abnormalities of gait and mobility ? ?Muscle weakness (generalized) ? ?Unsteadiness on feet ? ?PERTINENT HISTORY: RA, L1 Vertebral Fx, Lupus, Osteopenia, Seizure, Chiari malformation s/p posterior decompression and VP shunt, myoclonic seizures s/p VNS/VNS removal in 2013, functional ataxia, and chronic headaches, Psychogenic Movement Disorder.  ? ?PRECAUTIONS: RA, L1 Vertebral Fx, Lupus, Osteopenia, Seizure, Chiari malformation s/p posterior decompression and VP shunt, myoclonic seizures s/p VNS/VNS removal in 2013, functional ataxia, and chronic headaches, Psychogenic Movement Disorder.  ? ?SUBJECTIVE: Pt reports pain in both hips; due to having moved into 3rd level apartment last week and she had infusion last week which  always causes some soreness  ? ?PAIN:  ?Are you having pain? 3/10  ?Location; Bil. Hips; soreness in distal quads ?Sit on couch to get up aggravates the knee pain ?                         Alleviating:  ice helps some; rest/sleep has helped                                                                                                                                           ?  ?  ?  ?TODAY'S TREATMENT: ? NeuroRe-ed:  ?In // bars: Standing on airex- feet apart eyes open x 30 sec, eyes closed 30 sec x 2 with increased sway, feet together x 30 sec, head turns  left/right x 10 and up/down x 10, alternating taps on cone with fingertip support x 5 reps ? ?Marching on Airex inside // bars - EO 10 reps each leg with UE support prn ? ?Standing on AIrex - cone taps to 2 cones with each foot; then tipping cone over and standing upright with UE support prn for balance recovery  ? ?Standing on rockerboard positioned ant/post: maintaining steady eyes open x 10 sec, eyes closed x 10 sec with CGA/min assist ?Rocking board ant/post x 10 reps EO, then with EC 10 reps with UE support on // bars with UE support prn ? ?TherEx: ?Pt performed runner's stretch RLE 30 sec hold x 1 rep;  heel cord stretch 30 sec hold x 1 rep ? ?Quadruped position on mat; - pt performed hip extension 3 reps with 3 sec hold each leg: lifting opposite UE/LE 3 reps each side 5 sec hold ? ?Sci Fit x 5 min level 3.0with BUE and BLE for strengthening  ? ?  ?  ?  ?PATIENT EDUCATION: ? ?  ?  ?HOME EXERCISE PROGRAM: ?Access Code: [email or web address] ? ?  ?GOALS: ? ?  ?SHORT TERM GOALS: Target date: 03/02/2022 ?  ?Pt will be independent with initial HEP for improved balance/strength/stretching  ?Baseline: no HEP established; reports independence with current HEP ?Goal status: MET ?  ?2.  Pt will improve gait speed to >/= 2.62 ft/sec to demonstrate improved community ambulation ?Baseline: 2.45 ft/sec; 2.74 ft/sec ?Goal status:  MET ?  ?3.  Pt will improve 5x STS to </= 25 sec to  demo improved functional LE strength and balance  ?Baseline: 29.82;  21.00  secs with UE support, secs, 26.72 secs without UE support(more LE shakiness legs) ?Goal status: MET ?  ?4.  Pt will report 25% improvement in hip pain in functional activities  ?Baseline: pain limiting activities; patient reporting approx 35% improvement in hip pain with activities ?Goal status: MET ?  ?5. Pt will improve Berg Balance to >/= 44/56 to demonstrate improved standing balance and reduced fall risk ?Baseline: 40/56; 48/56 ?Goal status: MET ?  ?  ?LONG TERM GOALS: Target date: 03/30/2022 ?  ?Pt will be independent with final progression land and aquatic HEP for improved strength/balance  ?Baseline: no HEP established ?Goal status: INITIAL ?  ?2.  Pt will improve 5x STS to </= 20 sec to demo improved functional LE strength and balance  ?Baseline: 29.82 ?Goal status: INITIAL ?  ?3.  Pt will improve gait speed to >/= 2.9 ft/sec to demonstrate improved community ambulation ?Baseline: 2.45 ft/sec ?Goal status: INITIAL ?  ?4.  Pt will be able to ascend/descend stairs with reciprocal pattern and single rail without pain at Mod I level ?Baseline: supervision/CGA due to unsteadiness/jerkiness ?Goal status: INITIAL ?  ?5.  Pt will improve Berg Balance to >/= 48/56 to demonstrate improved standing balance and reduced fall risk ?Baseline: 40/56 ?Goal status: INITIAL ?  ?6.  Pt will reports 50% improvement in hip pain with functional activities and driving ?Baseline: limiting functional activities.  ?Goal status: INITIAL ?  ?ASSESSMENT: ?  ?CLINICAL IMPRESSION: ?Pt had some difficulty maintaining balance in quadruped position with lifting opposite UE/LE.  Pt has tremors in LE's with RLE >LLE - pt able to stop tremor with quick knee flexion movement in standing.  Pt had mild postural instability with standing on Airex with EC. Cont with POC.  ? ?OBJECTIVE IMPAIRMENTS Abnormal gait, decreased activity tolerance, decreased balance, decreased  coordination, decreased endurance, difficulty walking,  decreased strength, increased muscle spasms, impaired sensation, and pain.  ?  ?ACTIVITY LIMITATIONS cleaning, community activity, driving, meal prep, and laundry.  ?  ?PERSONAL FACTORS Time since onset of injury/illness/exacerbation and 3+ comorbidities: RA, L1 Vertebral Fx, Lupus, Osteopenia, Seizure, Chiari malformation s/p posterior decompression and VP shunt, myoclonic seizures s/p VNS/VNS removal in 2013, functional ataxia, and chronic headaches, Psychogenic Movement Disorder  are also affecting patient's functional outcome.  ?  ?  ?REHAB POTENTIAL: Good ?  ?CLINICAL DECISION MAKING: Evolving/moderate complexity ?  ?EVALUATION COMPLEXITY: Moderate ?  ?PLAN:  10th visit progress note due next session;  ? ? ?PT FREQUENCY: 2x/week ?  ?PT DURATION: 8 weeks ?  ?PLANNED INTERVENTIONS: Therapeutic exercises, Therapeutic activity, Neuromuscular re-education, Balance training, Gait training, Patient/Family education, Joint manipulation, Joint mobilization, Stair training, Vestibular training, DME instructions, Aquatic Therapy, Dry Needling, Cryotherapy, Moist heat, and Manual therapy ?  ?PLAN FOR NEXT SESSION:  ?Check LTG's next land session and renew ? ?     Continue to work on strengthening and balance ex's; work on coordination and balance- tall kneeling, quadruped activities, compliant s    surfaces with and without vision removed. Begin to add in more functional gait assessment.  ? ? ? ? ? ?Guido Sander, PT ?[facility] ?[address], [address] ?[place], [place] [address] ?[phone] ?03/23/22, 9:10 AM  ? ?   ?

## 2022-03-26 ENCOUNTER — Ambulatory Visit: Payer: Medicare HMO | Admitting: Physical Therapy

## 2022-03-27 ENCOUNTER — Ambulatory Visit: Payer: Medicare HMO | Admitting: Physical Therapy

## 2022-03-28 ENCOUNTER — Encounter: Payer: Self-pay | Admitting: Physical Therapy

## 2022-03-28 ENCOUNTER — Ambulatory Visit: Payer: Medicare HMO | Admitting: Physical Therapy

## 2022-03-28 DIAGNOSIS — M6281 Muscle weakness (generalized): Secondary | ICD-10-CM | POA: Diagnosis not present

## 2022-03-28 DIAGNOSIS — R262 Difficulty in walking, not elsewhere classified: Secondary | ICD-10-CM | POA: Diagnosis not present

## 2022-03-28 DIAGNOSIS — R278 Other lack of coordination: Secondary | ICD-10-CM | POA: Diagnosis not present

## 2022-03-28 DIAGNOSIS — R2689 Other abnormalities of gait and mobility: Secondary | ICD-10-CM | POA: Diagnosis not present

## 2022-03-28 DIAGNOSIS — R2681 Unsteadiness on feet: Secondary | ICD-10-CM | POA: Diagnosis not present

## 2022-03-28 NOTE — Therapy (Signed)
?OUTPATIENT PHYSICAL THERAPY TREATMENT NOTE & 10th Visit Progress Note ? ? ?Progress Note ?Reporting Period 02-02-22 to 03-28-22 ? ?See note below for Objective Data and Assessment of Progress/Goals.  ? ?  ? ?Patient Name: Robin Paul ?MRN: [id number] ?DOB:Mar 20, 1971, 51 y.o., female ?Today's Date: 03/28/2022 ? ?PCP: Lawerance Cruel, MD ?REFERRING PROVIDER: Cameron Sprang, MD  ? ? PT End of Session - 03/28/22 1527   ? ? Visit Number 10   ? Number of Visits 17   ? Date for PT Re-Evaluation 03/30/22   ? Authorization Type [employer]   ? Authorization Time Period 17 visits from 02/02/22- 03/30/22   ? Authorization - Visit Number 10   ? Authorization - Number of Visits 17   ? Progress Note Due on Visit 20   ? PT Start Time 1310   ? PT Stop Time 4098   ? PT Time Calculation (min) 65 min   ? Equipment Utilized During Treatment Other (comment)   aquatic cuffs, large yellow noodle  ? Activity Tolerance Patient tolerated treatment well   ? Behavior During Therapy [facility] for tasks assessed/performed   ? ?  ?  ? ?  ? ? ?Past Medical History:  ?Diagnosis Date  ? Arthritis, rheumatoid ([place])   ? L1 vertebral fracture (HCC)   ? Lupus ([place])   ? Osteopenia   ? Seizure [facility])   ? 1998  ? ?Past Surgical History:  ?Procedure Laterality Date  ? SPINAL CORD STIMULATOR IMPLANT    ? ?There are no problems to display for this patient. ? ? ?REFERRING DIAG: M79.604 (ICD-10-CM) - Right leg pain  ? ?THERAPY DIAG:  ?Other abnormalities of gait and mobility ? ?Muscle weakness (generalized) ? ?Unsteadiness on feet ? ?PERTINENT HISTORY: RA, L1 Vertebral Fx, Lupus, Osteopenia, Seizure, Chiari malformation s/p posterior decompression and VP shunt, myoclonic seizures s/p VNS/VNS removal in 2013, functional ataxia, and chronic headaches, Psychogenic Movement Disorder.  ? ?PRECAUTIONS: RA, L1 Vertebral Fx, Lupus, Osteopenia, Seizure, Chiari malformation s/p posterior decompression and VP shunt, myoclonic seizures s/p VNS/VNS removal in 2013,  functional ataxia, and chronic headaches, Psychogenic Movement Disorder.  ? ?SUBJECTIVE: No new complaints. Had a relaxing weekend and slept a lot. Did increase her stimulator to go further up her back with improvement pain noted this morning.   ? ?PAIN:  ?Are you having pain? Yes ?NPRS scale: 5/10 ?Pain location: generalized due to RA, mostly in hips today ?Pain orientation: Other: back and joints   ?PAIN TYPE: Chronic ?Pain description: constant, aching, and sharp nerve pain a times   ?Aggravating factors: increased activity,and prolonged immobility ?Relieving factors: medication, spinal stimulator, lidocaine patches as needed, heat at times  ? ?                                                                                                                                                                       ?  ?  ?  ?  TODAY'S TREATMENT: ?  03-28-22 ? ?Aquatic therapy at [facility] - pool temp 90 degrees ? ? ?Patient seen for aquatic therapy today.  Treatment took place in water 4.0 - 4.8 feet deep depending upon activity.  Pt entered/exited pool via stairs with bil rails, step to pattern with supervision.  ? ?Pt performed Runner's stretch for each leg - 30 sec hold x 1 rep each; gastroc stretch with toes/forefoot on pool wall 30 sec hold x 2 reps ? ?Pt used large yellow noodle for water walking in various directions - pt placed noodle behind her due to stating she would lose balance posteriorly; pt performed block practice for forward amb., 18' across width of pool; sideways 6 reps 18'; backwards amb. 18' x 4 reps with use of noodle for all directions ?Marching in place 20 reps each leg with pt holding onto noodle for assist with balance ? ?Squats 10 reps bil. LE's with UE support on pool edge; RLE unilateral squat 5 reps due to difficulty (bil. UE support used) ? ?Pt performed braiding 18' x 4 reps across width of pool for improved balance & coordination - noodle used for UE support ?Aquatic cuffs used on each  leg for increased resistance with eccentric contraction - hip flexion, extension and abduction 20 reps each direction each leg; hip abduction/adduction with knee flexed at 90 degrees each leg approx. 20 reps each ? ?Pt stood on each leg - moved contralateral leg forward/back with knee flexed at 90 degrees for improved SLS - used UE support on noodle ? ?Pt performed Ai Chi posture - Enclosing - 10 reps - some occasional LOB occurred - stability improved with cues to tighten core ? ?Pt performed closed chain modified push up for closed chain scapular strengthening  10 reps - pt requested an exercise to increase strength in middle trap region, between scapulae ? ?Pt requires buoyancy of water for support for reduced fall risk with gait training and balance exercises with minimal UE support; exercises able to be performed safely in water without the risk of fall compared to those same exercises performed on land;  viscosity of water needed for resistance for strengthening.  Current of water provides perturbations for challenging static & dynamic standing balance.    ?  ?   ? ?  ?  ?  ?PATIENT EDUCATION: ?Education details:continue with current HEP ?Person educated: Patient ?Education method: Explanation ?Education comprehension: verbalized understanding ?  ?  ?HOME EXERCISE PROGRAM: ?Access Code: [email or web address] ?URL: [email or web address] ?Date: 02/15/2022 ?Prepared by: Willow Ora ? ?Exercises ?- Seated Hamstring Stretch  - 1 x daily - 5 x weekly - 1 sets - 3 reps - 30 hold ?- Sit to Stand with Counter Support  - 1 x daily - 5 x weekly - 1 sets - 8-10 reps ?- Standing Balance in Corner with Eyes Closed  - 1 x daily - 5 x weekly - 1 sets - 3 reps - 30 seconds hold ?- Standing Near Stance in Corner  - 1 x daily - 5 x weekly - 1 sets - 10 reps ?- Wide Tandem Stance with Eyes Closed  - 1 x daily - 5 x weekly - 1 sets - 2 reps - 15 seconds hold ?- Tandem Walking with Counter Support  - 1 x daily - 5 x weekly - 1 sets  - 3 reps ?- Walking March  - 1 x daily - 5 x weekly - 1 sets - 3 reps ? ?  ?GOALS: ? ?  ?  SHORT TERM GOALS: Target date: 03/02/2022 ?  ?Pt will be independent with initial HEP for improved balance/strength/stretching  ?Baseline: no HEP established ?Goal status: MET ? ?2.  Pt will improve gait speed to >/= 2.62 ft/sec to demonstrate improved community ambulation ?Baseline: 2.45 ft/sec ?Goal status: MET ?  ?3.  Pt will improve 5x STS to </= 25 sec to demo improved functional LE strength and balance  ?Baseline: 29.82 ?Goal status: MET ?  ?4.  Pt will report 25% improvement in hip pain in functional activities  ?Baseline: pain limiting activities ?Goal status: MET ?  ?5. Pt will improve Berg Balance to >/= 44/56 to demonstrate improved standing balance and reduced fall risk ?Baseline: 40/56 ?Goal status: MET ?  ?  ?LONG TERM GOALS: Target date: 03/30/2022 ?  ?Pt will be independent with final progression land and aquatic HEP for improved strength/balance  ?Baseline: no HEP established ?Goal status: INITIAL ?  ?2.  Pt will improve 5x STS to </= 20 sec to demo improved functional LE strength and balance  ?Baseline: 29.82 ?Goal status: INITIAL ?  ?3.  Pt will improve gait speed to >/= 2.9 ft/sec to demonstrate improved community ambulation ?Baseline: 2.45 ft/sec ?Goal status: INITIAL ?  ?4.  Pt will be able to ascend/descend stairs with reciprocal pattern and single rail without pain at Mod I level ?Baseline: supervision/CGA due to unsteadiness/jerkiness ?Goal status: INITIAL ?  ?5.  Pt will improve Berg Balance to >/= 48/56 to demonstrate improved standing balance and reduced fall risk ?Baseline: 40/56 ?Goal status: INITIAL ?  ?6.  Pt will reports 50% improvement in hip pain with functional activities and driving ?Baseline: limiting functional activities.  ?Goal status: INITIAL ?  ?ASSESSMENT: ?  ?CLINICAL IMPRESSION: ?Today's skilled session continued to address gait, strengthening and balance in the aquatic setting with no  issues noted or reported. ?  ?  ?OBJECTIVE IMPAIRMENTS Abnormal gait, decreased activity tolerance, decreased balance, decreased coordination, decreased endurance, difficulty walking, decreased strength, incre

## 2022-03-29 ENCOUNTER — Ambulatory Visit: Payer: Medicare HMO | Admitting: Physical Therapy

## 2022-03-29 ENCOUNTER — Encounter: Payer: Self-pay | Admitting: Physical Therapy

## 2022-03-29 DIAGNOSIS — R2681 Unsteadiness on feet: Secondary | ICD-10-CM | POA: Diagnosis not present

## 2022-03-29 DIAGNOSIS — M6281 Muscle weakness (generalized): Secondary | ICD-10-CM | POA: Diagnosis not present

## 2022-03-29 DIAGNOSIS — R2689 Other abnormalities of gait and mobility: Secondary | ICD-10-CM | POA: Diagnosis not present

## 2022-03-29 DIAGNOSIS — R278 Other lack of coordination: Secondary | ICD-10-CM

## 2022-03-29 DIAGNOSIS — R262 Difficulty in walking, not elsewhere classified: Secondary | ICD-10-CM | POA: Diagnosis not present

## 2022-03-29 NOTE — Therapy (Addendum)
?OUTPATIENT PHYSICAL THERAPY TREATMENT NOTE ? ? ? ?Patient Name: Robin Paul ?MRN: [id number] ?DOB:[birth year], 51 y.o., female ?Today's Date: 03/30/2022 ? ?PCP: Lawerance Cruel, MD ?REFERRING PROVIDER: Cameron Sprang, MD  ? ? PT End of Session - 03/29/22 0937   ? ? Visit Number 11   ? Number of Visits 17   ? Date for PT Re-Evaluation 03/30/22   ? Authorization Type [employer]   ? Authorization Time Period 17 visits from 02/02/22- 03/30/22   ? Authorization - Visit Number 11   ? Authorization - Number of Visits 17   ? Progress Note Due on Visit 20   ? PT Start Time 1607   ? PT Stop Time 1015   ? PT Time Calculation (min) 40 min   ? Equipment Utilized During Treatment Other (comment)   aquatic cuffs, large yellow noodle  ? Activity Tolerance Patient tolerated treatment well   ? Behavior During Therapy [facility] for tasks assessed/performed   ? ?  ?  ? ?  ? ? ?Past Medical History:  ?Diagnosis Date  ? Arthritis, rheumatoid ([place])   ? L1 vertebral fracture (HCC)   ? Lupus ([place])   ? Osteopenia   ? Seizure [facility])   ? 1998  ? ?Past Surgical History:  ?Procedure Laterality Date  ? SPINAL CORD STIMULATOR IMPLANT    ? ?There are no problems to display for this patient. ? ? ?REFERRING DIAG: M79.604 (ICD-10-CM) - Right leg pain  ? ?THERAPY DIAG:  ?Other abnormalities of gait and mobility - Plan: PT plan of care cert/re-cert ? ?Muscle weakness (generalized) - Plan: PT plan of care cert/re-cert ? ?Unsteadiness on feet - Plan: PT plan of care cert/re-cert ? ?Difficulty in walking, not elsewhere classified - Plan: PT plan of care cert/re-cert ? ?Other lack of coordination - Plan: PT plan of care cert/re-cert ? ?PERTINENT HISTORY: RA, L1 Vertebral Fx, Lupus, Osteopenia, Seizure, Chiari malformation s/p posterior decompression and VP shunt, myoclonic seizures s/p VNS/VNS removal in 2013, functional ataxia, and chronic headaches, Psychogenic Movement Disorder.  ? ?PRECAUTIONS: RA, L1 Vertebral Fx, Lupus, Osteopenia, Seizure, Chiari  malformation s/p posterior decompression and VP shunt, myoclonic seizures s/p VNS/VNS removal in 2013, functional ataxia, and chronic headaches, Psychogenic Movement Disorder.  ? ?SUBJECTIVE: Was sore and tired today from longer pool session yesterday. No falls.  ? ?PAIN:  ?Are you having pain? Yes ?NPRS scale: 4-5/10 ?Pain location: generalized due to RA, mostly in hips today ?Pain orientation: Other: back and joints   ?PAIN TYPE: Chronic ?Pain description: constant, aching, and sharp nerve pain a times   ?Aggravating factors: increased activity,and prolonged immobility ?Relieving factors: medication, spinal stimulator, lidocaine patches as needed, heat at times  ? ?                                                                                                                                                                       ?  ?  ?  ?  TODAY'S TREATMENT: ? 03-29-22 ?  ? ?STAIRS: ?Level of Assistance:  min guard assist ?Stair Negotiation Technique: Step to Pattern ?Alternating Pattern  ?Forwards with Single Rail on Right ?Number of Stairs: 4  ?Height of Stairs: 6  ?Comments: reciprocal to ascend with right leg tremors noted, step to gait pattern to descend ? ?GAIT: ?Gait pattern: step through pattern, decreased stride length, and ataxic ?Distance walked: around clinic with session ?Assistive device utilized: None ?Level of assistance:  min guard assist due to ataxia/unsteadiness ?Comments: veering at times noted  ? ? [facility] PT Assessment - 03/29/22 0943   ? ?  ? Transfers  ? Five time sit to stand comments  24.56 seconds with no UE support from standard height surface, mild LE tremors noted with a few reps.   ?  ? Ambulation/Gait  ? Gait velocity 19.13 sec's= 1.71 ft/sec no AD   ?  ? Standardized Balance Assessment  ? Standardized Balance Assessment Berg Balance Test   ?  ? Berg Balance Test  ? Sit to Stand Able to stand without using hands and stabilize independently   ? Standing Unsupported Able to stand safely 2  minutes   ? Sitting with Back Unsupported but Feet Supported on Floor or Stool Able to sit safely and securely 2 minutes   ? Stand to Sit Sits safely with minimal use of hands   ? Transfers Able to transfer safely, minor use of hands   ? Standing Unsupported with Eyes Closed Able to stand 10 seconds safely   ? Standing Unsupported with Feet Together Able to place feet together independently and stand 1 minute safely   ? From Standing, Reach Forward with Outstretched Arm Can reach confidently >25 cm (10")   ? From Standing Position, Pick up Object from [place] to pick up shoe safely and easily   ? From Standing Position, Turn to Look Behind Over each Shoulder Looks behind from both sides and weight shifts well   ? Turn 360 Degrees Able to turn 360 degrees safely but slowly   5-6 seconds each way  ? Standing Unsupported, Alternately Place Feet on Step/Stool Able to stand independently and complete 8 steps >20 seconds   24.57 seconds  ? Standing Unsupported, One Foot in Front Able to plae foot ahead of the other independently and hold 30 seconds   ? Standing on One Leg Able to lift leg independently and hold equal to or more than 3 seconds   ~3 seconds  ? Total Score 50   ? Merrilee Jansky comment: 50/56   ? ?  ?  ? ?  ?  ?   ? ?  ?  ?  ?PATIENT EDUCATION: ?Education details: progress toward goals and plan to recert, aquatic appointments set up ?Person educated: Patient ?Education method: Explanation ?Education comprehension: verbalized understanding ?  ?  ?HOME EXERCISE PROGRAM: ?Access Code: [email or web address] ?URL: [email or web address] ?Date: 02/15/2022 ?Prepared by: Willow Ora ? ?Exercises ?- Seated Hamstring Stretch  - 1 x daily - 5 x weekly - 1 sets - 3 reps - 30 hold ?- Sit to Stand with Counter Support  - 1 x daily - 5 x weekly - 1 sets - 8-10 reps ?- Standing Balance in Corner with Eyes Closed  - 1 x daily - 5 x weekly - 1 sets - 3 reps - 30 seconds hold ?- Standing Near Stance in Corner  - 1 x daily - 5 x weekly  - 1 sets - 10 reps ?- Wide  Tandem Stance with Eyes Closed  - 1 x daily - 5 x weekly - 1 sets - 2 reps - 15 seconds hold ?- Tandem Walking with Counter Support  - 1 x daily - 5 x weekly - 1 sets - 3 reps ?- Walking March  - 1 x daily - 5 x weekly - 1 sets - 3 reps ? ?  ?GOALS: ? ?  ?SHORT TERM GOALS: Target date: 03/02/2022 ?  ?Pt will be independent with initial HEP for improved balance/strength/stretching  ?Baseline: no HEP established ?Goal status: MET ? ?2.  Pt will improve gait speed to >/= 2.62 ft/sec to demonstrate improved community ambulation ?Baseline: 2.45 ft/sec ?Goal status: MET ?  ?3.  Pt will improve 5x STS to </= 25 sec to demo improved functional LE strength and balance  ?Baseline: 29.82 ?Goal status: MET ?  ?4.  Pt will report 25% improvement in hip pain in functional activities  ?Baseline: pain limiting activities ?Goal status: MET ?  ?5. Pt will improve Berg Balance to >/= 44/56 to demonstrate improved standing balance and reduced fall risk ?Baseline: 40/56 ?Goal status: MET ?  ?  ?LONG TERM GOALS: Target date: 12-28-77 (Recert completed on 8-92-11) ?  ?Pt will be independent with final progression land and aquatic HEP for improved strength/balance  ?Baseline: 03/29/22: has programs, would benefit from advancement ?Goal status: PARTIALLY MET:  GOAL ONGOING ?  ?2.  Pt will improve 5x STS to </= 20 sec to demo improved functional LE strength and balance  ?Baseline: 03/29/22: 24.56 seconds no UE support from standard height surface, improved from STG assessment just not to goal ?Goal status: NOT MET; GOAL ONGOING ?  ?3.  Pt will improve gait speed to >/= 2.9 ft/sec to demonstrate improved community ambulation; REVISED GOAL TO >/= 2.5 ft/sec without device for increased efficiency with gait.  ?Baseline: 03/29/22: 1.71 ft/sec no AD  ?Goal status: NOT MET ; GOAL REVISED on 03-30-22 ?  ?4.  Pt will be able to ascend/descend stairs with reciprocal pattern and single rail without pain at Mod I level ?Baseline:  03/29/22: continues with supervision/CGA due to unsteadiness/jerkiness with descending only, Mod I with ascending ?Goal status: PARTIALLY MET; GOAL ONGOING ?  ?5.  Pt will improve Berg Balance to >/= 48/

## 2022-03-30 NOTE — Addendum Note (Signed)
Addended by: Lamar Benes on: 03/30/2022 09:30 AM ? ? Modules accepted: Orders ? ?

## 2022-04-02 ENCOUNTER — Encounter: Payer: Self-pay | Admitting: Physical Therapy

## 2022-04-02 ENCOUNTER — Ambulatory Visit: Payer: Medicare HMO | Admitting: Physical Therapy

## 2022-04-02 DIAGNOSIS — M6281 Muscle weakness (generalized): Secondary | ICD-10-CM | POA: Diagnosis not present

## 2022-04-02 DIAGNOSIS — R262 Difficulty in walking, not elsewhere classified: Secondary | ICD-10-CM

## 2022-04-02 DIAGNOSIS — R2689 Other abnormalities of gait and mobility: Secondary | ICD-10-CM

## 2022-04-02 DIAGNOSIS — R278 Other lack of coordination: Secondary | ICD-10-CM | POA: Diagnosis not present

## 2022-04-02 DIAGNOSIS — R2681 Unsteadiness on feet: Secondary | ICD-10-CM | POA: Diagnosis not present

## 2022-04-02 NOTE — Therapy (Signed)
?OUTPATIENT PHYSICAL THERAPY TREATMENT NOTE ? ? ? ?Patient Name: Robin Paul ?MRN: [id number] ?DOB:Nov 12, 1971, 51 y.o., female ?Today's Date: 04/02/2022 ? ?PCP: Lawerance Cruel, MD ?REFERRING PROVIDER: Cameron Sprang, MD  ? ? PT End of Session - 04/02/22 1424   ? ? Visit Number 12   ? Number of Visits 17   ? Date for PT Re-Evaluation 03/30/22   ? Authorization Type [employer]   ? Authorization Time Period 17 visits from 02/02/22- 03/30/22   ? Authorization - Visit Number 12   ? Authorization - Number of Visits 17   ? Progress Note Due on Visit 20   ? PT Start Time 1318   ? PT Stop Time 1400   ? PT Time Calculation (min) 42 min   ? Equipment Utilized During Treatment Other (comment)   large yellow noodle  ? Activity Tolerance Patient tolerated treatment well   ? Behavior During Therapy [facility] for tasks assessed/performed   ? ?  ?  ? ?  ? ? ?Past Medical History:  ?Diagnosis Date  ? Arthritis, rheumatoid ([place])   ? L1 vertebral fracture (HCC)   ? Lupus ([place])   ? Osteopenia   ? Seizure [facility])   ? 1998  ? ?Past Surgical History:  ?Procedure Laterality Date  ? SPINAL CORD STIMULATOR IMPLANT    ? ?There are no problems to display for this patient. ? ? ?REFERRING DIAG: M79.604 (ICD-10-CM) - Right leg pain  ? ?THERAPY DIAG:  ?Other abnormalities of gait and mobility ? ?Muscle weakness (generalized) ? ?Unsteadiness on feet ? ?Difficulty in walking, not elsewhere classified ? ?Other lack of coordination ? ?PERTINENT HISTORY: RA, L1 Vertebral Fx, Lupus, Osteopenia, Seizure, Chiari malformation s/p posterior decompression and VP shunt, myoclonic seizures s/p VNS/VNS removal in 2013, functional ataxia, and chronic headaches, Psychogenic Movement Disorder.  ? ?PRECAUTIONS: RA, L1 Vertebral Fx, Lupus, Osteopenia, Seizure, Chiari malformation s/p posterior decompression and VP shunt, myoclonic seizures s/p VNS/VNS removal in 2013, functional ataxia, and chronic headaches, Psychogenic Movement Disorder.  ? ?SUBJECTIVE: Was sore  and tired today from longer pool session yesterday. No falls.  ? ?PAIN:  ?Are you having pain? Yes ?NPRS scale: 4-5/10 ?Pain location: generalized due to RA, mostly in hips today ?Pain orientation: Other: back and joints   ?PAIN TYPE: Chronic ?Pain description: constant, aching, and sharp nerve pain a times   ?Aggravating factors: increased activity,and prolonged immobility ?Relieving factors: medication, spinal stimulator, lidocaine patches as needed, heat at times  ? ?                                                                                                                                                                       ?  ?  ?  ?  TODAY'S TREATMENT: ? 04-02-22 ?Aquatic therapy at [facility] - pool temp 92 degrees ? ?Patient seen for aquatic therapy today.  Treatment took place in water 3.5-4.5 feet deep depending upon activity.  Pt entered/exited pool via stairs with step to pattern leading with left LE for descent and right LE for ascending with bil rails.  ? ?Use of yellow pool noodle for gait into pool toward ~4.5 to 4.8 foot depth water with supervision ?Forward gait for 18 feet x 10 reps with emphasis on working on knee flexion with swing phase and equal step length ?Backward gait for 18 feet x 10 reps with emphasis on posture, step length and knee flexion to advance foot backward/clear bottom of pool. ?Side stepping left<>right for 18 feet x 8 laps toward each side with emphasis on posture ?  ? ?With back to wall in ~4.3-4.5 water depth ?Hip flexion/extension ?Hip flexion with abd/add ?Hip/knee flexion<>extending out into water<>back down ? ? ?No AD with min guard to supervision for gait from deeper end to bench in water ?Seated back on the bench for the following: ?Long arc quad ?Scissor kicks ?Flutter kicks ? ? ? ? ?Pt requires buoyancy of water for support for reduced fall risk with gait training and balance exercises with minimal UE support; exercises able to be performed safely in water  without the risk of fall compared to those same exercises performed on land;  viscosity of water needed for resistance for strengthening.  Current of water provides perturbations for challenging static & dynamic standing balance.    ?  ? ? ?  ?  ?   ? ?  ?  ?  ?PATIENT EDUCATION: ?Education details: progress toward goals and plan to recert, aquatic appointments set up ?Person educated: Patient ?Education method: Explanation ?Education comprehension: verbalized understanding ?  ?  ?HOME EXERCISE PROGRAM: ?Access Code: [email or web address] ?URL: [email or web address] ?Date: 02/15/2022 ?Prepared by: Willow Ora ? ?Exercises ?- Seated Hamstring Stretch  - 1 x daily - 5 x weekly - 1 sets - 3 reps - 30 hold ?- Sit to Stand with Counter Support  - 1 x daily - 5 x weekly - 1 sets - 8-10 reps ?- Standing Balance in Corner with Eyes Closed  - 1 x daily - 5 x weekly - 1 sets - 3 reps - 30 seconds hold ?- Standing Near Stance in Corner  - 1 x daily - 5 x weekly - 1 sets - 10 reps ?- Wide Tandem Stance with Eyes Closed  - 1 x daily - 5 x weekly - 1 sets - 2 reps - 15 seconds hold ?- Tandem Walking with Counter Support  - 1 x daily - 5 x weekly - 1 sets - 3 reps ?- Walking March  - 1 x daily - 5 x weekly - 1 sets - 3 reps ? ?  ?GOALS: ? ?  ?SHORT TERM GOALS: Target date: 03/02/2022 ?  ?Pt will be independent with initial HEP for improved balance/strength/stretching  ?Baseline: no HEP established ?Goal status: MET ? ?2.  Pt will improve gait speed to >/= 2.62 ft/sec to demonstrate improved community ambulation ?Baseline: 2.45 ft/sec ?Goal status: MET ?  ?3.  Pt will improve 5x STS to </= 25 sec to demo improved functional LE strength and balance  ?Baseline: 29.82 ?Goal status: MET ?  ?4.  Pt will report 25% improvement in hip pain in functional activities  ?Baseline: pain limiting activities ?Goal status: MET ?  ?5. Pt will improve [employer]  to >/= 44/56 to demonstrate improved standing balance and reduced fall risk ?Baseline:  40/56 ?Goal status: MET ?  ?  ?LONG TERM GOALS: Target date: 0-2-33 (Recert completed on 4-35-68) ?  ?Pt will be independent with final progression land and aquatic HEP for improved strength/balance  ?Baseline: 03/29/22: has programs, would benefit from advancement ?Goal status: PARTIALLY MET:  GOAL ONGOING ?  ?2.  Pt will improve 5x STS to </= 20 sec to demo improved functional LE strength and balance  ?Baseline: 03/29/22: 24.56 seconds no UE support from standard height surface, improved from STG assessment just not to goal ?Goal status: NOT MET; GOAL ONGOING ?  ?3.  Pt will improve gait speed to >/= 2.9 ft/sec to demonstrate improved community ambulation; REVISED GOAL TO >/= 2.5 ft/sec without device for increased efficiency with gait.  ?Baseline: 03/29/22: 1.71 ft/sec no AD  ?Goal status: NOT MET ; GOAL REVISED on 03-30-22 ?  ?4.  Pt will be able to ascend/descend stairs with reciprocal pattern and single rail without pain at Mod I level ?Baseline: 03/29/22: continues with supervision/CGA due to unsteadiness/jerkiness with descending only, Mod I with ascending ?Goal status: PARTIALLY MET; GOAL ONGOING ?  ?5.  Pt will improve Berg Balance to >/= 48/56 to demonstrate improved standing balance and reduced fall risk; GOAL UPGRADED; INCREASE SCORE TO >/= 52/56 to reduce fall risk.  ?Baseline: 03/29/22: 50/56 scored today  ?Goal status: NEW revised goal ?  ?6.  Pt will reports 50% improvement in hip pain with functional activities and driving ?Baseline: 03/29/22: pt does report improvement up to 50% ?Goal status: ONGOING ?  ?ASSESSMENT: ?  ?CLINICAL IMPRESSION: ?Today's skilled session continued to focus on gait, strengthening and stretching in the aquatic setting. Tremors of LE's, trunk and UE's noted at various locations and various times through out session, not all locations at one time. Self resolved with rest breaks. Did continue to report left knee stiffness and pain with certain knee bending ex's (those ex's stopped  if pain reported). No other issues noted or reported in session. The pt is making progress and should benefit from continued PT to progress toward unmet goals.  ?  ?  ?OBJECTIVE IMPAIRMENTS Abnormal gait, de

## 2022-04-03 ENCOUNTER — Ambulatory Visit: Payer: Medicare HMO | Admitting: Physical Therapy

## 2022-04-05 ENCOUNTER — Ambulatory Visit: Payer: Medicare HMO | Admitting: Physical Therapy

## 2022-04-06 DIAGNOSIS — M25561 Pain in right knee: Secondary | ICD-10-CM | POA: Diagnosis not present

## 2022-04-06 DIAGNOSIS — G894 Chronic pain syndrome: Secondary | ICD-10-CM | POA: Diagnosis not present

## 2022-04-06 DIAGNOSIS — M0579 Rheumatoid arthritis with rheumatoid factor of multiple sites without organ or systems involvement: Secondary | ICD-10-CM | POA: Diagnosis not present

## 2022-04-09 ENCOUNTER — Ambulatory Visit: Payer: Medicare HMO | Admitting: Physical Therapy

## 2022-04-12 ENCOUNTER — Ambulatory Visit: Payer: Medicare HMO | Admitting: Physical Therapy

## 2022-04-19 ENCOUNTER — Ambulatory Visit: Payer: Medicare HMO

## 2022-04-23 ENCOUNTER — Ambulatory Visit: Payer: Self-pay | Admitting: Physical Therapy

## 2022-04-26 ENCOUNTER — Ambulatory Visit: Payer: Medicare HMO

## 2022-04-30 ENCOUNTER — Ambulatory Visit: Payer: Self-pay | Admitting: Physical Therapy

## 2022-05-01 DIAGNOSIS — M25572 Pain in left ankle and joints of left foot: Secondary | ICD-10-CM | POA: Diagnosis not present

## 2022-05-01 DIAGNOSIS — M25561 Pain in right knee: Secondary | ICD-10-CM | POA: Diagnosis not present

## 2022-05-01 DIAGNOSIS — M25571 Pain in right ankle and joints of right foot: Secondary | ICD-10-CM | POA: Diagnosis not present

## 2022-05-01 DIAGNOSIS — M25562 Pain in left knee: Secondary | ICD-10-CM | POA: Diagnosis not present

## 2022-05-03 ENCOUNTER — Ambulatory Visit: Payer: Medicare HMO | Admitting: Physical Therapy

## 2022-05-07 ENCOUNTER — Ambulatory Visit: Payer: Self-pay | Admitting: Physical Therapy

## 2022-05-08 ENCOUNTER — Encounter: Payer: Self-pay | Admitting: Physical Therapy

## 2022-05-08 ENCOUNTER — Telehealth: Payer: Self-pay | Admitting: Physical Therapy

## 2022-05-08 ENCOUNTER — Ambulatory Visit: Payer: Medicare HMO | Attending: Neurology | Admitting: Physical Therapy

## 2022-05-08 DIAGNOSIS — M25572 Pain in left ankle and joints of left foot: Secondary | ICD-10-CM | POA: Insufficient documentation

## 2022-05-08 NOTE — Therapy (Signed)
[facility] [address] [place], [place], [address] Phone: [phone]   Fax:  [phone]  Physical Therapy Evaluation - ARRIVED/NO CHARGE  Patient Details  Name: Robin Paul MRN: [id number] Date of Birth: [birth year] Referring Physician:  Dr. Malon Kindle  Encounter Date: 05/08/2022   PT End of Session - 05/08/22 1109     Visit Number 0    Number of Visits --    Date for PT Re-Evaluation --    Authorization Type [employer]    Authorization Time Period 17 visits from 02/02/22- 03/30/22    Authorization - Visit Number --    Authorization - Number of Visits --    Progress Note Due on Visit --    PT Start Time 1018    PT Stop Time 1055    PT Time Calculation (min) 37 min    Equipment Utilized During Treatment Other (comment)   large yellow noodle   Activity Tolerance Patient tolerated treatment well    Behavior During Therapy WFL for tasks assessed/performed             Past Medical History:  Diagnosis Date   Arthritis, rheumatoid (HCC)    L1 vertebral fracture (HCC)    Lupus (HCC)    Osteopenia    Seizure (HCC)    1998    Past Surgical History:  Procedure Laterality Date   SPINAL CORD STIMULATOR IMPLANT      There were no vitals filed for this visit.       Pt arrived for PT eval with referral stating knee and ankle pain.  Pt currently amb. With antalgic gait pattern (no device) and unable to weight bear on LLE - pt is amb. On her Lt forefoot and hopping to reduce weight bearing on LLE in stance.  Pt reports the swelling in her Lt ankle significantly started increasing last Thursday, the 15th - after she was seen by Dr. Ranell Patrick on Tues., the 13th. Pt is unable to tolerate ROM and reports pain/tenderness with palpation of both Rt and Lt ankles.  Message was sent to Dr. Ranell Patrick requesting that pt be seen for follow up as soon as possible.  No charge due to no evaluation performed due to unknown  etiology of pain and edema and pt's inability to tolerate ROM             Objective measurements completed on examination: See above findings.                              Patient will benefit from skilled therapeutic intervention in order to improve the following deficits and impairments:     Visit Diagnosis: Pain in left ankle and joints of left foot     Problem List There are no problems to display for this patient.   Kary Kos, PT 05/08/2022, 11:20 AM  [facility] [address] [place], [place], [address] Phone: [phone]   Fax:  [phone]  Name: Robin Paul MRN: [id number] Date of Birth: [birth year]

## 2022-05-08 NOTE — Telephone Encounter (Signed)
Dr. Ranell Patrick, Robin Paul was seen today for PT eval for bil. Ankle pain.  She is currently unable to weight bear on her LLE due to ankle pain and tenderness.  She has significant swelling in Lt ankle, both medially and laterally. She reports her ankle is significantly more swollen than when she was seen last week, on 05-01-22 (states the increased swelling started last Thursday, the 15th).   She states she has an ultrasound scheduled, but not until July 12th.  We are holding on PT at this time due to her change in status and unknown etiology of the bilateral ankle pain and edema.  I have instructed her to call your office for urgent follow up.  Thank you, Kerry Fort, PT [phone]

## 2022-05-10 ENCOUNTER — Ambulatory Visit: Payer: Medicare HMO | Admitting: Physical Therapy

## 2022-05-10 DIAGNOSIS — Z79899 Other long term (current) drug therapy: Secondary | ICD-10-CM | POA: Diagnosis not present

## 2022-05-10 DIAGNOSIS — R5383 Other fatigue: Secondary | ICD-10-CM | POA: Diagnosis not present

## 2022-05-10 DIAGNOSIS — Z111 Encounter for screening for respiratory tuberculosis: Secondary | ICD-10-CM | POA: Diagnosis not present

## 2022-05-10 DIAGNOSIS — M0609 Rheumatoid arthritis without rheumatoid factor, multiple sites: Secondary | ICD-10-CM | POA: Diagnosis not present

## 2022-05-14 ENCOUNTER — Ambulatory Visit: Payer: Self-pay | Admitting: Physical Therapy

## 2022-05-17 ENCOUNTER — Ambulatory Visit: Payer: Medicare HMO | Admitting: Physical Therapy

## 2022-05-30 DIAGNOSIS — M25571 Pain in right ankle and joints of right foot: Secondary | ICD-10-CM | POA: Diagnosis not present

## 2022-05-30 DIAGNOSIS — M25572 Pain in left ankle and joints of left foot: Secondary | ICD-10-CM | POA: Diagnosis not present

## 2022-06-12 DIAGNOSIS — L52 Erythema nodosum: Secondary | ICD-10-CM | POA: Diagnosis not present

## 2022-06-12 DIAGNOSIS — M25571 Pain in right ankle and joints of right foot: Secondary | ICD-10-CM | POA: Diagnosis not present

## 2022-06-12 DIAGNOSIS — M25572 Pain in left ankle and joints of left foot: Secondary | ICD-10-CM | POA: Diagnosis not present

## 2022-06-15 DIAGNOSIS — M25561 Pain in right knee: Secondary | ICD-10-CM | POA: Diagnosis not present

## 2022-06-15 DIAGNOSIS — F1120 Opioid dependence, uncomplicated: Secondary | ICD-10-CM | POA: Diagnosis not present

## 2022-06-15 DIAGNOSIS — Z9689 Presence of other specified functional implants: Secondary | ICD-10-CM | POA: Diagnosis not present

## 2022-06-15 DIAGNOSIS — G894 Chronic pain syndrome: Secondary | ICD-10-CM | POA: Diagnosis not present

## 2022-06-15 DIAGNOSIS — M0579 Rheumatoid arthritis with rheumatoid factor of multiple sites without organ or systems involvement: Secondary | ICD-10-CM | POA: Diagnosis not present

## 2022-06-18 ENCOUNTER — Other Ambulatory Visit: Payer: Self-pay | Admitting: *Deleted

## 2022-06-18 DIAGNOSIS — M7989 Other specified soft tissue disorders: Secondary | ICD-10-CM

## 2022-06-21 DIAGNOSIS — D869 Sarcoidosis, unspecified: Secondary | ICD-10-CM | POA: Diagnosis not present

## 2022-06-21 DIAGNOSIS — Z0000 Encounter for general adult medical examination without abnormal findings: Secondary | ICD-10-CM | POA: Diagnosis not present

## 2022-06-21 DIAGNOSIS — M329 Systemic lupus erythematosus, unspecified: Secondary | ICD-10-CM | POA: Diagnosis not present

## 2022-06-21 DIAGNOSIS — M069 Rheumatoid arthritis, unspecified: Secondary | ICD-10-CM | POA: Diagnosis not present

## 2022-06-21 DIAGNOSIS — E041 Nontoxic single thyroid nodule: Secondary | ICD-10-CM | POA: Diagnosis not present

## 2022-06-21 DIAGNOSIS — E7800 Pure hypercholesterolemia, unspecified: Secondary | ICD-10-CM | POA: Diagnosis not present

## 2022-06-21 DIAGNOSIS — F430 Acute stress reaction: Secondary | ICD-10-CM | POA: Diagnosis not present

## 2022-06-21 DIAGNOSIS — M3500 Sicca syndrome, unspecified: Secondary | ICD-10-CM | POA: Diagnosis not present

## 2022-06-21 DIAGNOSIS — Z79899 Other long term (current) drug therapy: Secondary | ICD-10-CM | POA: Diagnosis not present

## 2022-06-26 ENCOUNTER — Ambulatory Visit (HOSPITAL_COMMUNITY)
Admission: RE | Admit: 2022-06-26 | Discharge: 2022-06-26 | Disposition: A | Discharge status: Home / Self Care | Payer: Medicare HMO | Source: Ambulatory Visit | Attending: Vascular Surgery | Admitting: Vascular Surgery

## 2022-06-26 DIAGNOSIS — M7989 Other specified soft tissue disorders: Secondary | ICD-10-CM | POA: Insufficient documentation

## 2022-06-27 ENCOUNTER — Ambulatory Visit: Payer: Medicare HMO | Admitting: Vascular Surgery

## 2022-06-27 ENCOUNTER — Encounter: Payer: Self-pay | Admitting: Vascular Surgery

## 2022-06-27 VITALS — BP 106/70 | HR 69 | Temp 98.0°F | Resp 16 | Ht 69.0 in | Wt 134.0 lb

## 2022-06-27 DIAGNOSIS — M25561 Pain in right knee: Secondary | ICD-10-CM | POA: Diagnosis not present

## 2022-06-27 DIAGNOSIS — M79641 Pain in right hand: Secondary | ICD-10-CM | POA: Diagnosis not present

## 2022-06-27 DIAGNOSIS — Z79899 Other long term (current) drug therapy: Secondary | ICD-10-CM | POA: Diagnosis not present

## 2022-06-27 DIAGNOSIS — I872 Venous insufficiency (chronic) (peripheral): Secondary | ICD-10-CM | POA: Diagnosis not present

## 2022-06-27 DIAGNOSIS — M25579 Pain in unspecified ankle and joints of unspecified foot: Secondary | ICD-10-CM | POA: Diagnosis not present

## 2022-06-27 DIAGNOSIS — M79672 Pain in left foot: Secondary | ICD-10-CM | POA: Diagnosis not present

## 2022-06-27 DIAGNOSIS — M79642 Pain in left hand: Secondary | ICD-10-CM | POA: Diagnosis not present

## 2022-06-27 DIAGNOSIS — G8929 Other chronic pain: Secondary | ICD-10-CM | POA: Diagnosis not present

## 2022-06-27 DIAGNOSIS — M1990 Unspecified osteoarthritis, unspecified site: Secondary | ICD-10-CM | POA: Diagnosis not present

## 2022-06-27 DIAGNOSIS — M25562 Pain in left knee: Secondary | ICD-10-CM | POA: Diagnosis not present

## 2022-06-27 DIAGNOSIS — M79673 Pain in unspecified foot: Secondary | ICD-10-CM | POA: Diagnosis not present

## 2022-06-27 DIAGNOSIS — M79671 Pain in right foot: Secondary | ICD-10-CM | POA: Diagnosis not present

## 2022-06-27 DIAGNOSIS — M25473 Effusion, unspecified ankle: Secondary | ICD-10-CM | POA: Diagnosis not present

## 2022-06-27 DIAGNOSIS — M2550 Pain in unspecified joint: Secondary | ICD-10-CM | POA: Diagnosis not present

## 2022-06-27 DIAGNOSIS — M0609 Rheumatoid arthritis without rheumatoid factor, multiple sites: Secondary | ICD-10-CM | POA: Diagnosis not present

## 2022-06-27 DIAGNOSIS — R21 Rash and other nonspecific skin eruption: Secondary | ICD-10-CM | POA: Diagnosis not present

## 2022-06-27 NOTE — Progress Notes (Signed)
ASSESSMENT & PLAN   CHRONIC VENOUS INSUFFICIENCY: This patient does have evidence of deep venous reflux in the left lower extremity based on her noninvasive study yesterday.  However I am not convinced that the rash in her distal left leg is necessarily hyperpigmentation related to high venous pressure.  I think the rheumatologic workup will likely be more revealing.  Regardless we have discussed the treatment for venous insufficiency.  Specifically we discussed the importance of daily leg elevation and the proper positioning for this.  I have encouraged her to consider a knee-high compression stocking with a gradient of 15 to 20 mmHg.  We discussed the importance of exercise specifically walking and water aerobics.  I encouraged her to avoid prolonged sitting and standing.  Of note she did not have any significant superficial venous reflux and therefore is not a candidate for laser ablation.  Likewise I do not think she has significant arterial insufficiency and certainly that would not explain this rash.  I will be happy to see her back at any time if any new vascular issues arise.  REASON FOR CONSULT:    Bilateral lower extremity swelling.  The consult is requested by Dr. Patsy Lager.  HPI:   Robin Paul is a 51 y.o. female who presents with bilateral lower extremity swelling which is more significant on the left side.  She states that in mid May of this year she developed fairly acute onset of swelling in the left ankle on the medial side and also some on the right leg on the posterior aspect of her lower leg.  She developed a rash and this has persisted.  She does have a history of rheumatoid arthritis.  She denies any previous history of DVT or phlebitis.  She has had no previous venous procedures.  She has recently been seen by her rheumatologist and a large battery of tests have been ordered.  She does describe some occasional aching pain in her legs associated with prolonged standing or  walking.  Past Medical History:  Diagnosis Date   Arthritis, rheumatoid (HCC)    L1 vertebral fracture (HCC)    Lupus (HCC)    Osteopenia    Seizure (HCC)    1998    History reviewed. No pertinent family history.  SOCIAL HISTORY: Social History   Tobacco Use   Smoking status: Never   Smokeless tobacco: Never  Substance Use Topics   Alcohol use: Never    Allergies  Allergen Reactions   Codeine Other (See Comments) and Itching   Erythromycin Nausea And Vomiting and Nausea Only   Hydrocodone    Fentanyl Rash    Current Outpatient Medications  Medication Sig Dispense Refill   ascorbic acid (VITAMIN C) 1000 MG tablet      b complex vitamins capsule Take by mouth.     Calcium Carbonate-Vit D-Min (CALCIUM 1200 PO) Take by mouth 2 (two) times daily.     Cholecalciferol 125 MCG (5000 UT) TABS 10,000 UT daily     clonazePAM (KLONOPIN) 0.5 MG tablet Take 1 tab in the morning 2 tabs at night 90 tablet 5   Collagen-Boron-Hyaluronic Acid (MOVE FREE ULTRA JOINT HEALTH) 40-5-3.3 MG TABS See admin instructions.     FLUoxetine (PROZAC) 20 MG capsule 1 capsule     folic acid (FOLVITE) 1 MG tablet Take 3 mg by mouth. 3 mg daily     golimumab (SIMPONI ARIA) 50 MG/4ML SOLN injection Strength: 50 mg/4 mL; Form: solution; SIG: take 0 intravenously  every 8 weeks     HYDROmorphone HCl (EXALGO) 8 MG TB24 take 1 tablet by oral route  every day at the same time each day swallowing whole. Do not break, crush, dissolve and/or chew. DNF 01/08/21     magnesium gluconate (MAGONATE) 500 MG tablet Take by mouth.     methocarbamol (ROBAXIN) 750 MG tablet Take by mouth.     Methotrexate, PF, 30 MG/0.6ML SOAJ Inject into the skin.     Multiple Vitamins-Minerals (THERA-M) TABS Take 1 tablet by mouth daily.     topiramate (TOPAMAX) 25 MG tablet Take 1 tablet in the morning, and 3 tablets at night at bedtime 360 tablet 3   vitamin E 180 MG (400 UNITS) capsule 1 capsule     co-enzyme Q-10 30 MG capsule Take 30  mg by mouth daily. (Patient not taking: Reported on 06/27/2022)     No current facility-administered medications for this visit.    REVIEW OF SYSTEMS:  [X]  denotes positive finding, [ ]  denotes negative finding Cardiac  Comments:  Chest pain or chest pressure:    Shortness of breath upon exertion:    Short of breath when lying flat:    Irregular heart rhythm:        Vascular    Pain in calf, thigh, or hip brought on by ambulation:    Pain in feet at night that wakes you up from your sleep:     Blood clot in your veins:    Leg swelling:  x       Pulmonary    Oxygen at home:    Productive cough:     Wheezing:         Neurologic    Sudden weakness in arms or legs:     Sudden numbness in arms or legs:     Sudden onset of difficulty speaking or slurred speech:    Temporary loss of vision in one eye:     Problems with dizziness:         Gastrointestinal    Blood in stool:     Vomited blood:         Genitourinary    Burning when urinating:     Blood in urine:        Psychiatric    Lollar depression:         Hematologic    Bleeding problems:    Problems with blood clotting too easily:        Skin    Rashes or ulcers:        Constitutional    Fever or chills:    -  PHYSICAL EXAM:   Vitals:   06/27/22 1303  BP: 106/70  Pulse: 69  Resp: 16  Temp: 98 F (36.7 C)  TempSrc: Temporal  SpO2: 98%  Weight: 134 lb (60.8 kg)  Height: 5\' 9"  (1.753 m)   Body mass index is 19.79 kg/m.  GENERAL: The patient is a well-nourished female, in no acute distress. The vital signs are documented above. CARDIAC: There is a regular rate and rhythm.  VASCULAR: I do not detect carotid bruits. On the right side she has a palpable femoral and popliteal pulse.  I cannot palpate pedal pulses.  She does however have a biphasic posterior tibial signal, anterior tibial signal with the Doppler. On the left side she has a palpable femoral, popliteal, and dorsalis pedis signal.  She has a  biphasic posterior tibial signal in addition on the left. Currently she  has minimal swelling in both lower extremities. PULMONARY: There is good air exchange bilaterally without wheezing or rales. ABDOMEN: Soft and non-tender with normal pitched bowel sounds.  I do not palpate an abdominal aortic aneurysm. MUSCULOSKELETAL: There are no Schillaci deformities. NEUROLOGIC: No focal weakness or paresthesias are detected. SKIN: She has a rash on her medial distal left leg as noted in the photograph below.   She also have some rash on the right side.    PSYCHIATRIC: The patient has a normal affect.  DATA:    VENOUS DUPLEX: I have reviewed the venous duplex scan that was done yesterday.  This was of the left lower extremity only.  There was no evidence of DVT.  There is deep venous reflux in the femoral vein and popliteal vein.  There was minimal superficial venous reflux.     Waverly Ferrari [facility]

## 2022-07-05 DIAGNOSIS — M0609 Rheumatoid arthritis without rheumatoid factor, multiple sites: Secondary | ICD-10-CM | POA: Diagnosis not present

## 2022-07-19 DIAGNOSIS — M0609 Rheumatoid arthritis without rheumatoid factor, multiple sites: Secondary | ICD-10-CM | POA: Diagnosis not present

## 2022-07-25 DIAGNOSIS — M1990 Unspecified osteoarthritis, unspecified site: Secondary | ICD-10-CM | POA: Diagnosis not present

## 2022-07-25 DIAGNOSIS — Z79899 Other long term (current) drug therapy: Secondary | ICD-10-CM | POA: Diagnosis not present

## 2022-07-25 DIAGNOSIS — M2550 Pain in unspecified joint: Secondary | ICD-10-CM | POA: Diagnosis not present

## 2022-07-25 DIAGNOSIS — M0609 Rheumatoid arthritis without rheumatoid factor, multiple sites: Secondary | ICD-10-CM | POA: Diagnosis not present

## 2022-07-25 DIAGNOSIS — G8929 Other chronic pain: Secondary | ICD-10-CM | POA: Diagnosis not present

## 2022-07-25 DIAGNOSIS — R768 Other specified abnormal immunological findings in serum: Secondary | ICD-10-CM | POA: Diagnosis not present

## 2022-07-30 ENCOUNTER — Encounter: Payer: Self-pay | Admitting: Neurology

## 2022-07-30 ENCOUNTER — Ambulatory Visit: Payer: Medicare HMO | Admitting: Neurology

## 2022-07-30 VITALS — BP 104/64 | HR 66 | Ht 69.0 in | Wt 138.0 lb

## 2022-07-30 DIAGNOSIS — R519 Headache, unspecified: Secondary | ICD-10-CM | POA: Diagnosis not present

## 2022-07-30 DIAGNOSIS — G40909 Epilepsy, unspecified, not intractable, without status epilepticus: Secondary | ICD-10-CM

## 2022-07-30 DIAGNOSIS — R251 Tremor, unspecified: Secondary | ICD-10-CM | POA: Diagnosis not present

## 2022-07-30 MED ORDER — CLONAZEPAM 0.5 MG PO TABS: ORAL_TABLET | ORAL | 5 refills | Status: DC

## 2022-07-30 MED ORDER — TOPIRAMATE 25 MG PO TABS: ORAL_TABLET | ORAL | 3 refills | Status: DC

## 2022-07-30 NOTE — Patient Instructions (Signed)
Good to see you doing better.  Increase Topiramate 25mg : take 2 tablets in AM, 3 tablets in PM  2. Refills sent for clonazepam  3. Follow-up in 6 months, call for any changes   Seizure Precautions: 1. If medication has been prescribed for you to prevent seizures, take it exactly as directed.  Do not stop taking the medicine without talking to your doctor first, even if you have not had a seizure in a long time.   2. Avoid activities in which a seizure would cause danger to yourself or to others.  Don't operate dangerous machinery, swim alone, or climb in high or dangerous places, such as on ladders, roofs, or girders.  Do not drive unless your doctor says you may.  3. If you have any warning that you may have a seizure, lay down in a safe place where you can't hurt yourself.    4.  No driving for 6 months from last seizure, as per [facility].   Please refer to the following link on the [employer]ile[employer] of [employer]'s website for more information: http://www.epilepsyfoundation.org/answerplace/Social/driving/drivingu.cfm   5.  Maintain good sleep hygiene. Avoid alcohol.  6.  Contact your doctor if you have any problems that may be related to the medicine you are taking.  7.  Call 911 and bring the patient back to the ED if:        A.  The seizure lasts longer than 5 minutes.       B.  The patient doesn't awaken shortly after the seizure  C.  The patient has new problems such as difficulty seeing, speaking or moving  D.  The patient was injured during the seizure  E.  The patient has a temperature over 102 F (39C)  F.  The patient vomited and now is having trouble breathing

## 2022-07-30 NOTE — Progress Notes (Signed)
NEUROLOGY FOLLOW UP OFFICE NOTE  Robin Paul [id number] Jun 15, 1971  HISTORY OF PRESENT ILLNESS: I had the pleasure of seeing Robin Paul in follow-up in the neurology clinic on 07/30/2022.  The patient was last seen 6 months ago for seizures. She has a history of Chiari malformation s/p posterior decompression and VP shunt, myoclonic seizures s/p VNS/VNS removal in 2013, functional ataxia, and chronic headaches. She has been evaluated by a Movement Disorders specialist in the past with a diagnosis of psychogenic movement disorder. Her headaches have been controlled on Topiramate 25/75mg  and prn Cambia. She was on clonazepam 0.5mg  1 tab in AM, 2 tabs in PM for muscle spasms/jerks. She continued to report right leg shaking/jerking. EEG done 12/2020 was normal, episode of right leg jerking did not show any EEG correlate. She had a head CT without contrast in 01/2021 which did not show any acute changes, suboccipital craniectomy with mild underlying cerebellar encephalomalacia.   Since her last visit, she has had a rough few months with her RA. She had been doing physical therapy for bilateral hip pain and had been doing well, until both ankle "just blew up." She saw Ortho who noted fluid in the tendons but nothing structurally wrong. She had also seen Vascular and it appears there is some venous insufficiency. She then saw a different rheumatologist who changed all her medications. She was switched to Orencia, methotrexate, and prednisone, with significant improvement in her symptoms. She has had 2 Orencia infusions and notes that she has had more headaches, different than her typical headaches. She usually sleeps for 12 hours after her infusion, then the next day she has a throbbing headache in both temples. She takes Tylenol immediately but it does not really help. She is on Topiramate  1 tablet in AM, 3 tablets in PM for headache prophylaxis. She takes the clonazepam 0.5mg  1/2 tab in AM, 1 tab in PM  for the body jerks/seizures. If she misses a dose of Topiramate in the morning, she would have jerking the afternoon. She reports head jerking to the right, no loss of consciousness. She usually takes an additional clonazepam around 1-2 times a month. She reports less stress with improved health and moving to her own place, however she may need to move in with her mother in the future. No falls.     History on Initial Assessment 01/09/2021: This is a 51 year old left-handed woman with a history of Chiari malformation s/p posterior fossa decompression and VP shunt in 1991, history of myoclonic seizures s/p VNS/VNS removal in 2013, functional ataxia, chronic headaches, SCS placement, presenting to establish care. She has been seeing neurologist [name] in [place], [place] since 2015, records were reviewed. She was evaluated at [facility] for VP shunt malfunctioning and essentially felt to benefit from VP shunt removal (hypotensive headaches, VP shunt causing occipital neuralgia), with significant improvement of headaches with shunt removal. Headaches controlled on Topiramate 25/75 and prn Cambia. She has clonazepam as needed for muscle spasm/muscle pain. She takes 1-2 tablets of clonazepam to "keep the muscles at bay." She had her last refill in December and had been spreading it out and cutting in half, seeing a difference when she is unable to take the medication. She would go through a series of 4-5 spasms in a row, she can feel it gathering up in her chest, one arm, then the whole body. She has not had any falls due to these, the falls come from the right leg jerking that "no  one has been able to figure out." She would stand and the left starts shaking. When she had her SCS done, her spine doctor told her it had nothing to do with her spine and wanted her to go to the [facility]. The clonazepam helps with her right leg shaking as well. Last fall was a weeks ago, due to a combination of going up a step and right leg  shaking. There is no loss of awareness/consciousness with the leg shaking. She reports a sensation like a goose egg in in the right frontal region. There are times when after getting up she would have pain localized at that spot. It was felt to be due to her neck or prior surgeries. Pain occurs every other week but comes on pretty strong, she has noticed when really bad she would have dried blood in her nostrils. She takes Aleve or Tylenol which may or may not help. She takes Dilaudid for her back but the head pain does not respond to this. She does not sleep well, with difficulties in sleep maintenance. She had been seeing a therapist in [facility] who also helped with her migraines. She reports myoclonic seizure where diagnosed in her mid to late 46s when they were occurring "all the time, up to 20 in a day." Her leg would jerk and she would kick the desk. At the same time, she was having headaches. She had an EEG and was diagnosed with myoclonic jerks at age 87. She was tried on different medications until she settled on Topamax in 1998 which has worked the best. She takes 25 in AM, 75mg  in PM and feels this dose is works best with her body metabolism. She went up to 200mg  in the apst which caused side effects, they were able to get the dose down when she had a VNS, then VNS was taken out in 2013. She moved back to [place] at the end of June, her divorce was finalized last week. She lives alone.   Review of prior notes on Epic from her neurologist in 2015: "she was labeled with clonic seizures, I do not have her long-term EEG report available from a different facility (2008), but reportedly it was described as normal." She had an EEG in 2015 with occasional focal delta slowing over the left temporal region, additionally occasional bitemporal theta slowing during wake state. She was evaluated by a Movement Disorder specialist in 2016 for tremors in both legs despite treatment with Baclofen, Gabapentin, Lyrica, Cymbalta,  Topamax, Keppra, carbamazepine, valproic acid. It was noted the jerks occur along with vocal sounds. She has tremor in hands, legs and head occasionally, triggered by certain positions. 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 PT and exercise were recommended, as well as increase in clonazepam dose to 1 tab TID.  She brings a copy of her EMG/NCV of her legs done at [facility] in October 2019 showing bilateral, inactive, lumbosacral radiculopathies (L5-S1 on the right and S2 on the left). She had numbness in her legs, worse after sitting for prolonged periods. She had incontinence when the setting on her SCS were too high.   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 There is no history of febrile convulsions, CNS infections such as meningitis/encephalitis, significant traumatic brain injury, neurosurgical procedures, or family history of seizures.  Prior ASMs: Gabapentin, Lyrica, Cymbalta, Topamax, Keppra, carbamazepine, valproic acid.   PAST MEDICAL HISTORY: Past Medical History:  Diagnosis Date  Arthritis, rheumatoid (HCC)    L1 vertebral fracture (HCC)    Lupus (HCC)    Osteopenia    Seizure (HCC)    1998    MEDICATIONS: Current Outpatient Medications on File Prior to Visit  Medication Sig Dispense Refill   abatacept (ORENCIA) 250 MG injection as directed Intravenous     ascorbic acid (VITAMIN C) 1000 MG tablet      b complex vitamins capsule Take by mouth.     Calcium Carbonate-Vit D-Min (CALCIUM 1200 PO) Take by mouth 2 (two) times daily.     Cholecalciferol 125 MCG (5000 UT) TABS 10,000 UT daily     clonazePAM (KLONOPIN) 0.5 MG tablet Take 1 tab in the morning 2 tabs at night 90 tablet 5   Collagen-Boron-Hyaluronic Acid (MOVE FREE ULTRA JOINT HEALTH) 40-5-3.3 MG TABS See admin instructions.     FLUoxetine (PROZAC) 20 MG capsule 1 capsule     folic acid (FOLVITE) 1 MG tablet Take 4 mg by mouth. 3 mg daily      HYDROmorphone HCl (EXALGO) 8 MG TB24 take 1 tablet by oral route  every day at the same time each day swallowing whole. Do not break, crush, dissolve and/or chew. DNF 01/08/21     magnesium gluconate (MAGONATE) 500 MG tablet Take by mouth.     Methotrexate, PF, 30 MG/0.6ML SOAJ Inject into the skin.     Multiple Vitamins-Minerals (THERA-M) TABS Take 1 tablet by mouth daily.     predniSONE (DELTASONE) 5 MG tablet Take 10 mg by mouth daily as needed.     topiramate (TOPAMAX) 25 MG tablet Take 1 tablet in the morning, and 3 tablets at night at bedtime 360 tablet 3   No current facility-administered medications on file prior to visit.    ALLERGIES: Allergies  Allergen Reactions   Codeine Other (See Comments) and Itching   Erythromycin Nausea And Vomiting and Nausea Only   Hydrocodone    Fentanyl Rash    FAMILY HISTORY: History reviewed. No pertinent family history.  SOCIAL HISTORY: Social History   Socioeconomic History   Marital status: Legally Separated    Spouse name: Not on file   Number of children: Not on file   Years of education: Not on file   Highest education level: Not on file  Occupational History   Not on file  Tobacco Use   Smoking status: Never   Smokeless tobacco: Never  Vaping Use   Vaping Use: Never used  Substance and Sexual Activity   Alcohol use: Never   Drug use: Never   Sexual activity: Not on file  Other Topics Concern   Not on file  Social History Narrative   ** Merged History Encounter ** Left handed    Lives alone    Left handed    Social Determinants of Health   Financial Resource Strain: Not on file  Food Insecurity: Not on file  Transportation Needs: Not on file  Physical Activity: Not on file  Stress: Not on file  Social Connections: Not on file  Intimate Partner Violence: Not on file     PHYSICAL EXAM: Vitals:   07/30/22 1110  BP: 104/64  Pulse: 66  SpO2: 96%   General: No acute distress Head:   Normocephalic/atraumatic Skin/Extremities: No rash, no edema Neurological Exam: alert and oriented to person, place, and time. No aphasia or dysarthria. Fund of knowledge is appropriate.  Recent and remote memory are intact.  Attention and concentration are normal.   Cranial  nerves: Pupils equal, round. Extraocular movements intact with no nystagmus. Visual fields full.  No facial asymmetry.  Motor: Bulk and tone normal, muscle strength 5/5 throughout with no pronator drift.   Finger to nose testing intact.  Gait slow and cautious, favors left leg, no ataxia. No tremors today.   IMPRESSION: This is a 51 yo LH woman with a history of Chiari malformation s/p posterior fossa decompression and VP shunt in 1991, history of myoclonic seizures s/p VNS/VNS removal in 2013, functional ataxia, chronic headaches, chronic back pain s/p SCS placement. Head CT no acute changes, EEG normal with right leg jerking showing normal EEG, indicating this is not epileptic. She was seen in the past by a Movement Disorders specialist and diagnosed with psychogenic movement disorder. Functional jerks and tremors occur in patients with chronic pain syndromes such as back pain. She has been having more headaches, we discussed increasing Topiramate : Take 2 tablets in AM, 3 tablets in PM. Refills sent for clonazepam 0.5mg  1 tab in AM, 2 tabs in PM (she usually takes lower dose). She is aware of [place] driving laws to stop driving after a seizure until 6 months seizure-free. Follow-up in 6 months, call for any changes.   Thank you for allowing me to participate in her care.  Please do not hesitate to call for any questions or concerns.    Patrcia Dolly, M.D.   CC: Dr. Tenny Craw

## 2022-08-02 DIAGNOSIS — M0609 Rheumatoid arthritis without rheumatoid factor, multiple sites: Secondary | ICD-10-CM | POA: Diagnosis not present

## 2022-08-14 ENCOUNTER — Telehealth: Payer: Self-pay | Admitting: Neurology

## 2022-08-14 MED ORDER — CLONAZEPAM 1 MG PO TABS: ORAL_TABLET | ORAL | 5 refills | Status: DC

## 2022-08-14 NOTE — Telephone Encounter (Signed)
Dr Delice Lesch  sent in the prescription for clonazepam 1mg : take 1/2 tablet in AM, 1 tablet in PM, which would be the same dose to how she was taking her 0.5mg  tablets previously. Pt verbalized understanding

## 2022-08-14 NOTE — Telephone Encounter (Signed)
Pls let her know I sent in the prescription for clonazepam 1mg : take 1/2 tablet in AM, 1 tablet in PM, which would be the same dose to how she was taking her 0.5mg  tablets previously. Thanks

## 2022-08-14 NOTE — Telephone Encounter (Signed)
Patient states her pharmacy cant get the 0.5mg  clonazepam. Saralyn Pilar suggested aquino send a prescription in for 1mg  and it can be broken in half. [place] would like a call back to make sure this is being handled. Stated it was the pharmacy we have on file

## 2022-08-23 ENCOUNTER — Other Ambulatory Visit: Payer: Self-pay | Admitting: Neurology

## 2022-08-28 DIAGNOSIS — F1120 Opioid dependence, uncomplicated: Secondary | ICD-10-CM | POA: Diagnosis not present

## 2022-08-28 DIAGNOSIS — G894 Chronic pain syndrome: Secondary | ICD-10-CM | POA: Diagnosis not present

## 2022-08-28 DIAGNOSIS — Z9689 Presence of other specified functional implants: Secondary | ICD-10-CM | POA: Diagnosis not present

## 2022-08-28 DIAGNOSIS — M25561 Pain in right knee: Secondary | ICD-10-CM | POA: Diagnosis not present

## 2022-08-28 DIAGNOSIS — M0579 Rheumatoid arthritis with rheumatoid factor of multiple sites without organ or systems involvement: Secondary | ICD-10-CM | POA: Diagnosis not present

## 2022-08-30 DIAGNOSIS — M0609 Rheumatoid arthritis without rheumatoid factor, multiple sites: Secondary | ICD-10-CM | POA: Diagnosis not present

## 2022-09-18 DIAGNOSIS — Z79899 Other long term (current) drug therapy: Secondary | ICD-10-CM | POA: Diagnosis not present

## 2022-09-18 DIAGNOSIS — M0609 Rheumatoid arthritis without rheumatoid factor, multiple sites: Secondary | ICD-10-CM | POA: Diagnosis not present

## 2022-09-18 DIAGNOSIS — R768 Other specified abnormal immunological findings in serum: Secondary | ICD-10-CM | POA: Diagnosis not present

## 2022-09-18 DIAGNOSIS — M2550 Pain in unspecified joint: Secondary | ICD-10-CM | POA: Diagnosis not present

## 2022-09-18 DIAGNOSIS — M1990 Unspecified osteoarthritis, unspecified site: Secondary | ICD-10-CM | POA: Diagnosis not present

## 2022-09-18 DIAGNOSIS — G8929 Other chronic pain: Secondary | ICD-10-CM | POA: Diagnosis not present

## 2022-09-27 DIAGNOSIS — M0609 Rheumatoid arthritis without rheumatoid factor, multiple sites: Secondary | ICD-10-CM | POA: Diagnosis not present

## 2022-10-24 ENCOUNTER — Other Ambulatory Visit: Payer: Self-pay | Admitting: Neurology

## 2022-10-25 DIAGNOSIS — M0609 Rheumatoid arthritis without rheumatoid factor, multiple sites: Secondary | ICD-10-CM | POA: Diagnosis not present

## 2022-11-05 DIAGNOSIS — Z79899 Other long term (current) drug therapy: Secondary | ICD-10-CM | POA: Diagnosis not present

## 2022-11-05 DIAGNOSIS — M2550 Pain in unspecified joint: Secondary | ICD-10-CM | POA: Diagnosis not present

## 2022-11-05 DIAGNOSIS — R768 Other specified abnormal immunological findings in serum: Secondary | ICD-10-CM | POA: Diagnosis not present

## 2022-11-05 DIAGNOSIS — G8929 Other chronic pain: Secondary | ICD-10-CM | POA: Diagnosis not present

## 2022-11-05 DIAGNOSIS — M1990 Unspecified osteoarthritis, unspecified site: Secondary | ICD-10-CM | POA: Diagnosis not present

## 2022-11-05 DIAGNOSIS — M0609 Rheumatoid arthritis without rheumatoid factor, multiple sites: Secondary | ICD-10-CM | POA: Diagnosis not present

## 2022-11-29 DIAGNOSIS — G894 Chronic pain syndrome: Secondary | ICD-10-CM | POA: Diagnosis not present

## 2022-11-29 DIAGNOSIS — F1120 Opioid dependence, uncomplicated: Secondary | ICD-10-CM | POA: Diagnosis not present

## 2022-11-29 DIAGNOSIS — Z9689 Presence of other specified functional implants: Secondary | ICD-10-CM | POA: Diagnosis not present

## 2022-12-04 DIAGNOSIS — M0609 Rheumatoid arthritis without rheumatoid factor, multiple sites: Secondary | ICD-10-CM | POA: Diagnosis not present

## 2022-12-18 DIAGNOSIS — M0609 Rheumatoid arthritis without rheumatoid factor, multiple sites: Secondary | ICD-10-CM | POA: Diagnosis not present

## 2022-12-20 DIAGNOSIS — F430 Acute stress reaction: Secondary | ICD-10-CM | POA: Diagnosis not present

## 2022-12-20 DIAGNOSIS — M3500 Sicca syndrome, unspecified: Secondary | ICD-10-CM | POA: Diagnosis not present

## 2022-12-20 DIAGNOSIS — Z6820 Body mass index (BMI) 20.0-20.9, adult: Secondary | ICD-10-CM | POA: Diagnosis not present

## 2022-12-20 DIAGNOSIS — D84821 Immunodeficiency due to drugs: Secondary | ICD-10-CM | POA: Diagnosis not present

## 2022-12-20 DIAGNOSIS — G40909 Epilepsy, unspecified, not intractable, without status epilepticus: Secondary | ICD-10-CM | POA: Diagnosis not present

## 2022-12-20 DIAGNOSIS — F1120 Opioid dependence, uncomplicated: Secondary | ICD-10-CM | POA: Diagnosis not present

## 2022-12-20 DIAGNOSIS — M0609 Rheumatoid arthritis without rheumatoid factor, multiple sites: Secondary | ICD-10-CM | POA: Diagnosis not present

## 2023-01-01 DIAGNOSIS — M0609 Rheumatoid arthritis without rheumatoid factor, multiple sites: Secondary | ICD-10-CM | POA: Diagnosis not present

## 2023-01-07 DIAGNOSIS — H524 Presbyopia: Secondary | ICD-10-CM | POA: Diagnosis not present

## 2023-01-07 DIAGNOSIS — Z0100 Encounter for examination of eyes and vision without abnormal findings: Secondary | ICD-10-CM | POA: Diagnosis not present

## 2023-01-09 DIAGNOSIS — Z1212 Encounter for screening for malignant neoplasm of rectum: Secondary | ICD-10-CM | POA: Diagnosis not present

## 2023-01-09 DIAGNOSIS — Z1211 Encounter for screening for malignant neoplasm of colon: Secondary | ICD-10-CM | POA: Diagnosis not present

## 2023-01-10 DIAGNOSIS — M25512 Pain in left shoulder: Secondary | ICD-10-CM | POA: Diagnosis not present

## 2023-01-19 LAB — COLOGUARD: COLOGUARD: NEGATIVE

## 2023-01-29 DIAGNOSIS — M0609 Rheumatoid arthritis without rheumatoid factor, multiple sites: Secondary | ICD-10-CM | POA: Diagnosis not present

## 2023-01-29 DIAGNOSIS — M25512 Pain in left shoulder: Secondary | ICD-10-CM | POA: Diagnosis not present

## 2023-02-05 DIAGNOSIS — F1120 Opioid dependence, uncomplicated: Secondary | ICD-10-CM | POA: Diagnosis not present

## 2023-02-05 DIAGNOSIS — G894 Chronic pain syndrome: Secondary | ICD-10-CM | POA: Diagnosis not present

## 2023-02-05 DIAGNOSIS — Z9689 Presence of other specified functional implants: Secondary | ICD-10-CM | POA: Diagnosis not present

## 2023-02-06 DIAGNOSIS — G8929 Other chronic pain: Secondary | ICD-10-CM | POA: Diagnosis not present

## 2023-02-06 DIAGNOSIS — M25512 Pain in left shoulder: Secondary | ICD-10-CM | POA: Diagnosis not present

## 2023-02-06 DIAGNOSIS — M75100 Unspecified rotator cuff tear or rupture of unspecified shoulder, not specified as traumatic: Secondary | ICD-10-CM | POA: Diagnosis not present

## 2023-02-06 DIAGNOSIS — M0609 Rheumatoid arthritis without rheumatoid factor, multiple sites: Secondary | ICD-10-CM | POA: Diagnosis not present

## 2023-02-06 DIAGNOSIS — M2550 Pain in unspecified joint: Secondary | ICD-10-CM | POA: Diagnosis not present

## 2023-02-06 DIAGNOSIS — R768 Other specified abnormal immunological findings in serum: Secondary | ICD-10-CM | POA: Diagnosis not present

## 2023-02-06 DIAGNOSIS — Z79899 Other long term (current) drug therapy: Secondary | ICD-10-CM | POA: Diagnosis not present

## 2023-02-06 DIAGNOSIS — M1990 Unspecified osteoarthritis, unspecified site: Secondary | ICD-10-CM | POA: Diagnosis not present

## 2023-02-07 DIAGNOSIS — M25512 Pain in left shoulder: Secondary | ICD-10-CM | POA: Diagnosis not present

## 2023-02-14 ENCOUNTER — Other Ambulatory Visit: Payer: Self-pay | Admitting: Specialist

## 2023-02-14 DIAGNOSIS — M25512 Pain in left shoulder: Secondary | ICD-10-CM

## 2023-02-20 ENCOUNTER — Ambulatory Visit
Admission: RE | Admit: 2023-02-20 | Discharge: 2023-02-20 | Disposition: A | Discharge status: Home / Self Care | Payer: Medicare HMO | Source: Ambulatory Visit | Attending: Specialist | Admitting: Specialist

## 2023-02-20 DIAGNOSIS — M25512 Pain in left shoulder: Secondary | ICD-10-CM | POA: Diagnosis not present

## 2023-02-21 DIAGNOSIS — M25512 Pain in left shoulder: Secondary | ICD-10-CM | POA: Diagnosis not present

## 2023-02-25 ENCOUNTER — Other Ambulatory Visit: Payer: Self-pay | Admitting: Neurology

## 2023-02-25 DIAGNOSIS — Z9889 Other specified postprocedural states: Secondary | ICD-10-CM | POA: Diagnosis not present

## 2023-02-25 DIAGNOSIS — Z01419 Encounter for gynecological examination (general) (routine) without abnormal findings: Secondary | ICD-10-CM | POA: Diagnosis not present

## 2023-02-26 DIAGNOSIS — M0609 Rheumatoid arthritis without rheumatoid factor, multiple sites: Secondary | ICD-10-CM | POA: Diagnosis not present

## 2023-02-27 ENCOUNTER — Ambulatory Visit: Payer: Medicare HMO | Admitting: Neurology

## 2023-02-27 ENCOUNTER — Encounter: Payer: Self-pay | Admitting: Neurology

## 2023-02-27 VITALS — BP 107/68 | HR 67 | Ht 69.0 in | Wt 140.4 lb

## 2023-02-27 DIAGNOSIS — R251 Tremor, unspecified: Secondary | ICD-10-CM | POA: Diagnosis not present

## 2023-02-27 DIAGNOSIS — M25512 Pain in left shoulder: Secondary | ICD-10-CM | POA: Diagnosis not present

## 2023-02-27 DIAGNOSIS — R519 Headache, unspecified: Secondary | ICD-10-CM | POA: Diagnosis not present

## 2023-02-27 DIAGNOSIS — G40909 Epilepsy, unspecified, not intractable, without status epilepticus: Secondary | ICD-10-CM | POA: Diagnosis not present

## 2023-02-27 MED ORDER — TOPIRAMATE 25 MG PO TABS: ORAL_TABLET | ORAL | 3 refills | Status: DC

## 2023-02-27 MED ORDER — CLONAZEPAM 1 MG PO TABS: ORAL_TABLET | ORAL | 5 refills | Status: DC

## 2023-02-27 NOTE — Progress Notes (Signed)
NEUROLOGY FOLLOW UP OFFICE NOTE  Robin Paul [id number] 1971-08-13  HISTORY OF PRESENT ILLNESS: I had the pleasure of seeing Robin Paul in follow-up in the neurology clinic on 02/26/2022.  The patient was last seen 7 months ago for seizures and migraines. She has a history of Chiari malformation s/p posterior decompression and VP shunt, myoclonic seizures s/p VNS/VNS removal in 2013, functional ataxia, and chronic headaches. She has been evaluated by a Movement Disorders specialist in the past with a diagnosis of psychogenic movement disorder. On her last visit, she reported an increase in headaches. Topiramate 25mg  increased to 2 tabs in AM, 3 tabs in PM. She reports that once she was off Orencia, the headaches quieted down and she reduced back to 1 tab in AM, 3 tabs in PM with stable headaches. Recently though she is having so much other pain going on (left shoulder, back, off Exalgo), that it is difficult to say how often headaches are. She was taking clonazepam 1mg  1/2 tab BID but the jerks were increasing and she increased back to original dose of 1/2 tab in AM, 1 tab in PM which helped. It does not help with sleep, she states insurance stopping coverage for Exalgo really messed up everything for her, sleep has been a challenge. She gets a little dizzy standing up. She was switched to Dilaudid, she is also on Tizanidine for shoulder pain, and has her spinal cord stimulator. She denies any falls. She lives with her mother now who has dementia, which is also stressful.    History on Initial Assessment 01/09/2021: This is a 52 year old left-handed woman with a history of Chiari malformation s/p posterior fossa decompression and VP shunt in 1991, history of myoclonic seizures s/p VNS/VNS removal in 2013, functional ataxia, chronic headaches, SCS placement, presenting to establish care. She has been seeing neurologist [name] in [place], [place] since 2015, records were reviewed. She was evaluated at  [facility] for VP shunt malfunctioning and essentially felt to benefit from VP shunt removal (hypotensive headaches, VP shunt causing occipital neuralgia), with significant improvement of headaches with shunt removal. Headaches controlled on Topiramate 25/75 and prn Cambia. She has clonazepam as needed for muscle spasm/muscle pain. She takes 1-2 tablets of clonazepam to "keep the muscles at bay." She had her last refill in December and had been spreading it out and cutting in half, seeing a difference when she is unable to take the medication. She would go through a series of 4-5 spasms in a row, she can feel it gathering up in her chest, one arm, then the whole body. She has not had any falls due to these, the falls come from the right leg jerking that "no one has been able to figure out." She would stand and the left starts shaking. When she had her SCS done, her spine doctor told her it had nothing to do with her spine and wanted her to go to the [facility]. The clonazepam helps with her right leg shaking as well. Last fall was a weeks ago, due to a combination of going up a step and right leg shaking. There is no loss of awareness/consciousness with the leg shaking. She reports a sensation like a goose egg in in the right frontal region. There are times when after getting up she would have pain localized at that spot. It was felt to be due to her neck or prior surgeries. Pain occurs every other week but comes on pretty strong, she has  noticed when really bad she would have dried blood in her nostrils. She takes Aleve or Tylenol which may or may not help. She takes Dilaudid for her back but the head pain does not respond to this. She does not sleep well, with difficulties in sleep maintenance. She had been seeing a therapist in [facility] who also helped with her migraines. She reports myoclonic seizure where diagnosed in her mid to late 57s when they were occurring "all the time, up to 20 in a day." Her leg would jerk and  she would kick the desk. At the same time, she was having headaches. She had an EEG and was diagnosed with myoclonic jerks at age 26. She was tried on different medications until she settled on Topamax in 1998 which has worked the best. She takes 25 in AM, 75mg  in PM and feels this dose is works best with her body metabolism. She went up to 200mg  in the apst which caused side effects, they were able to get the dose down when she had a VNS, then VNS was taken out in 2013. She moved back to  at the end of June, her divorce was finalized last week. She lives alone.   Review of prior notes on Epic from her neurologist in 2015: "she was labeled with clonic seizures, I do not have her long-term EEG report available from a different facility (2008), but reportedly it was described as normal." She had an EEG in 2015 with occasional focal delta slowing over the left temporal region, additionally occasional bitemporal theta slowing during wake state. She was evaluated by a Movement Disorder specialist in 2016 for tremors in both legs despite treatment with Baclofen, Gabapentin, Lyrica, Cymbalta, Topamax, Keppra, carbamazepine, valproic acid. It was noted the jerks occur along with vocal sounds. She has tremor in hands, legs and head occasionally, triggered by certain positions. 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 PT and exercise were recommended, as well as increase in clonazepam dose to 1 tab TID.  She brings a copy of her EMG/NCV of her legs done at [facility] in October 2019 showing bilateral, inactive, lumbosacral radiculopathies (L5-S1 on the right and S2 on the left). She had numbness in her legs, worse after sitting for prolonged periods. She had incontinence when the setting on her SCS were too high.   EEG done 12/2020 was normal, episode of right leg jerking did not show any EEG correlate. She had a head CT without contrast in 01/2021 which did not show any acute changes, suboccipital  craniectomy with mild underlying cerebellar encephalomalacia.   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 There is no history of febrile convulsions, CNS infections such as meningitis/encephalitis, significant traumatic brain injury, neurosurgical procedures, or family history of seizures.  Prior ASMs: Gabapentin, Lyrica, Cymbalta, Topamax, Keppra, carbamazepine, valproic acid.   PAST MEDICAL HISTORY: Past Medical History:  Diagnosis Date   Arthritis, rheumatoid (HCC)    L1 vertebral fracture (HCC)    Lupus (HCC)    Osteopenia    Seizure (HCC)    1998    MEDICATIONS: Current Outpatient Medications on File Prior to Visit  Medication Sig Dispense Refill   abatacept (ORENCIA) 250 MG injection as directed Intravenous     ascorbic acid (VITAMIN C) 1000 MG tablet      b complex vitamins capsule Take by mouth.     Calcium Carbonate-Vit D-Min (CALCIUM 1200 PO) Take by mouth 2 (two) times  daily.     Cholecalciferol 125 MCG (5000 UT) TABS 10,000 UT daily     clonazePAM (KLONOPIN) 1 MG tablet Take 1/2 tablet (0.5mg )in AM, 1 tablet (1mg ) at night 45 tablet 5   Collagen-Boron-Hyaluronic Acid (MOVE FREE ULTRA JOINT HEALTH) 40-5-3.3 MG TABS See admin instructions.     FLUoxetine (PROZAC) 20 MG capsule 1 capsule     folic acid (FOLVITE) 1 MG tablet Take 4 mg by mouth. 3 mg daily     HYDROmorphone HCl (EXALGO) 8 MG TB24 take 1 tablet by oral route  every day at the same time each day swallowing whole. Do not break, crush, dissolve and/or chew. DNF 01/08/21     magnesium gluconate (MAGONATE) 500 MG tablet Take by mouth.     Methotrexate, PF, 30 MG/0.6ML SOAJ Inject into the skin.     Multiple Vitamins-Minerals (THERA-M) TABS Take 1 tablet by mouth daily.     predniSONE (DELTASONE) 5 MG tablet Take 10 mg by mouth daily as needed.     topiramate (TOPAMAX) 25 MG tablet TAKE 1 TABLET BY MOUTH EVERY MORNING THEN TAKE 3 TABLETS BY MOUTH EVERY NIGHT AT BEDTIME  360 tablet 0   No current facility-administered medications on file prior to visit.    ALLERGIES: Allergies  Allergen Reactions   Codeine Other (See Comments) and Itching   Erythromycin Nausea And Vomiting and Nausea Only   Hydrocodone    Fentanyl Rash    FAMILY HISTORY: No family history on file.  SOCIAL HISTORY: Social History   Socioeconomic History   Marital status: Legally Separated    Spouse name: Not on file   Number of children: Not on file   Years of education: Not on file   Highest education level: Not on file  Occupational History   Not on file  Tobacco Use   Smoking status: Never   Smokeless tobacco: Never  Vaping Use   Vaping Use: Never used  Substance and Sexual Activity   Alcohol use: Never   Drug use: Never   Sexual activity: Not on file  Other Topics Concern   Not on file  Social History Narrative   ** Merged History Encounter ** Left handed    Lives alone    Left handed    Social Determinants of Health   Financial Resource Strain: Not on file  Food Insecurity: Not on file  Transportation Needs: Not on file  Physical Activity: Not on file  Stress: Not on file  Social Connections: Not on file  Intimate Partner Violence: Not on file     PHYSICAL EXAM: Vitals:   02/27/23 1003  BP: 107/68  Pulse: 67  SpO2: 99%   General: No acute distress, uncomfortable due to left shoulder pain Head:  Normocephalic/atraumatic Skin/Extremities: No rash, no edema Neurological Exam: alert and awake. No aphasia or dysarthria. Fund of knowledge is appropriate.  Attention and concentration are normal.   Cranial nerves: Pupils equal, round. Extraocular movements intact with no nystagmus. Visual fields full.  No facial asymmetry.  Motor: Bulk and tone normal, muscle strength 5/5 on right UE, both LE. Unable to test left UE due to shoulder pain with movements.  Finger to nose testing intact on right.  Gait slightly wide-based, no ataxia. No tremors today.     IMPRESSION: This is a 52 yo LH woman with a history of Chiari malformation s/p posterior fossa decompression and VP shunt in 1991, history of myoclonic seizures s/p VNS/VNS removal in 2013, functional ataxia, chronic  headaches, chronic back pain s/p SCS placement. Head CT no acute changes, EEG normal with right leg jerking showing normal EEG, indicating this is not epileptic. She was seen in the past by a Movement Disorders specialist and diagnosed with psychogenic movement disorder. Functional jerks and tremors occur in patients with chronic pain syndromes such as back pain. Headaches stable, continue Topiramate : 1 tablet in AM, 3 tablets in PM. Refills also sent for clonazepam  1/2 tab in AM, 1 tab in PM. She is aware of [place] driving laws to stop driving after a seizure until 6 months seizure-free. Follow-up in 1 year, call for any changes.   Thank you for allowing me to participate in her care.  Please do not hesitate to call for any questions or concerns.    Patrcia Dolly, M.D.   CC: Dr. Tenny Craw

## 2023-02-27 NOTE — Patient Instructions (Signed)
Good to see you. Wishing you all the best. Continue all your medications, refills sent. Follow-up in 1 year, call for any changes.

## 2023-02-28 ENCOUNTER — Ambulatory Visit
Admission: RE | Admit: 2023-02-28 | Discharge: 2023-02-28 | Disposition: A | Discharge status: Home / Self Care | Payer: Medicare HMO | Source: Ambulatory Visit | Attending: Specialist | Admitting: Specialist

## 2023-02-28 ENCOUNTER — Other Ambulatory Visit: Payer: Self-pay | Admitting: Specialist

## 2023-02-28 DIAGNOSIS — G8929 Other chronic pain: Secondary | ICD-10-CM | POA: Diagnosis not present

## 2023-02-28 DIAGNOSIS — M25512 Pain in left shoulder: Secondary | ICD-10-CM | POA: Diagnosis not present

## 2023-02-28 DIAGNOSIS — M19012 Primary osteoarthritis, left shoulder: Secondary | ICD-10-CM | POA: Diagnosis not present

## 2023-02-28 MED ORDER — IOPAMIDOL (ISOVUE-M 200) INJECTION 41%
13.0000 mL | Freq: Once | INTRAMUSCULAR | Status: AC
  Administered 2023-02-28: 13 mL via INTRA_ARTICULAR

## 2023-03-04 DIAGNOSIS — D84821 Immunodeficiency due to drugs: Secondary | ICD-10-CM | POA: Diagnosis not present

## 2023-03-04 DIAGNOSIS — M069 Rheumatoid arthritis, unspecified: Secondary | ICD-10-CM | POA: Diagnosis not present

## 2023-03-04 DIAGNOSIS — G40909 Epilepsy, unspecified, not intractable, without status epilepticus: Secondary | ICD-10-CM | POA: Diagnosis not present

## 2023-03-04 DIAGNOSIS — M25512 Pain in left shoulder: Secondary | ICD-10-CM | POA: Diagnosis not present

## 2023-03-04 DIAGNOSIS — F1120 Opioid dependence, uncomplicated: Secondary | ICD-10-CM | POA: Diagnosis not present

## 2023-03-04 DIAGNOSIS — M3500 Sicca syndrome, unspecified: Secondary | ICD-10-CM | POA: Diagnosis not present

## 2023-03-04 DIAGNOSIS — Z6820 Body mass index (BMI) 20.0-20.9, adult: Secondary | ICD-10-CM | POA: Diagnosis not present

## 2023-03-04 DIAGNOSIS — E041 Nontoxic single thyroid nodule: Secondary | ICD-10-CM | POA: Diagnosis not present

## 2023-03-12 DIAGNOSIS — M0609 Rheumatoid arthritis without rheumatoid factor, multiple sites: Secondary | ICD-10-CM | POA: Diagnosis not present

## 2023-03-12 DIAGNOSIS — E041 Nontoxic single thyroid nodule: Secondary | ICD-10-CM | POA: Diagnosis not present

## 2023-03-12 DIAGNOSIS — D869 Sarcoidosis, unspecified: Secondary | ICD-10-CM | POA: Diagnosis not present

## 2023-03-12 DIAGNOSIS — E7800 Pure hypercholesterolemia, unspecified: Secondary | ICD-10-CM | POA: Diagnosis not present

## 2023-03-12 DIAGNOSIS — M329 Systemic lupus erythematosus, unspecified: Secondary | ICD-10-CM | POA: Diagnosis not present

## 2023-03-12 DIAGNOSIS — M8580 Other specified disorders of bone density and structure, unspecified site: Secondary | ICD-10-CM | POA: Diagnosis not present

## 2023-03-14 DIAGNOSIS — M7502 Adhesive capsulitis of left shoulder: Secondary | ICD-10-CM | POA: Diagnosis not present

## 2023-03-14 DIAGNOSIS — M25512 Pain in left shoulder: Secondary | ICD-10-CM | POA: Diagnosis not present

## 2023-03-18 DIAGNOSIS — E041 Nontoxic single thyroid nodule: Secondary | ICD-10-CM | POA: Diagnosis not present

## 2023-03-21 ENCOUNTER — Other Ambulatory Visit: Payer: Self-pay | Admitting: Internal Medicine

## 2023-03-21 DIAGNOSIS — E041 Nontoxic single thyroid nodule: Secondary | ICD-10-CM

## 2023-03-26 DIAGNOSIS — M0609 Rheumatoid arthritis without rheumatoid factor, multiple sites: Secondary | ICD-10-CM | POA: Diagnosis not present

## 2023-03-29 DIAGNOSIS — M7502 Adhesive capsulitis of left shoulder: Secondary | ICD-10-CM | POA: Diagnosis not present

## 2023-04-02 ENCOUNTER — Other Ambulatory Visit: Payer: Self-pay | Admitting: Internal Medicine

## 2023-04-02 DIAGNOSIS — E041 Nontoxic single thyroid nodule: Secondary | ICD-10-CM

## 2023-04-08 ENCOUNTER — Ambulatory Visit
Admission: RE | Admit: 2023-04-08 | Discharge: 2023-04-08 | Disposition: A | Discharge status: Home / Self Care | Payer: Medicare HMO | Source: Ambulatory Visit | Attending: Internal Medicine | Admitting: Internal Medicine

## 2023-04-08 DIAGNOSIS — E041 Nontoxic single thyroid nodule: Secondary | ICD-10-CM | POA: Diagnosis not present

## 2023-04-23 DIAGNOSIS — M0609 Rheumatoid arthritis without rheumatoid factor, multiple sites: Secondary | ICD-10-CM | POA: Diagnosis not present

## 2023-04-25 ENCOUNTER — Ambulatory Visit
Admission: RE | Admit: 2023-04-25 | Discharge: 2023-04-25 | Disposition: A | Discharge status: Home / Self Care | Payer: Medicare HMO | Source: Ambulatory Visit | Attending: Internal Medicine | Admitting: Internal Medicine

## 2023-04-25 ENCOUNTER — Other Ambulatory Visit (HOSPITAL_COMMUNITY)
Admission: RE | Admit: 2023-04-25 | Discharge: 2023-04-25 | Disposition: A | Discharge status: Home / Self Care | Payer: Medicare HMO | Source: Ambulatory Visit | Attending: Internal Medicine | Admitting: Internal Medicine

## 2023-04-25 DIAGNOSIS — E041 Nontoxic single thyroid nodule: Secondary | ICD-10-CM | POA: Diagnosis not present

## 2023-04-25 DIAGNOSIS — E2749 Other adrenocortical insufficiency: Secondary | ICD-10-CM | POA: Diagnosis not present

## 2023-04-25 DIAGNOSIS — E559 Vitamin D deficiency, unspecified: Secondary | ICD-10-CM | POA: Diagnosis not present

## 2023-04-25 NOTE — Procedures (Signed)
Successful US guided FNA of right superior thyroid nodule No complications. See PACS for full report.    Alex Gardener, AGNP-BC 04/25/2023, 3:44 PM

## 2023-04-30 LAB — CYTOLOGY - NON PAP

## 2023-05-07 DIAGNOSIS — G894 Chronic pain syndrome: Secondary | ICD-10-CM | POA: Diagnosis not present

## 2023-05-07 DIAGNOSIS — Z9689 Presence of other specified functional implants: Secondary | ICD-10-CM | POA: Diagnosis not present

## 2023-05-07 DIAGNOSIS — F1120 Opioid dependence, uncomplicated: Secondary | ICD-10-CM | POA: Diagnosis not present

## 2023-05-07 DIAGNOSIS — M0579 Rheumatoid arthritis with rheumatoid factor of multiple sites without organ or systems involvement: Secondary | ICD-10-CM | POA: Diagnosis not present

## 2023-05-08 DIAGNOSIS — G894 Chronic pain syndrome: Secondary | ICD-10-CM | POA: Diagnosis not present

## 2023-05-09 DIAGNOSIS — R768 Other specified abnormal immunological findings in serum: Secondary | ICD-10-CM | POA: Diagnosis not present

## 2023-05-09 DIAGNOSIS — M2550 Pain in unspecified joint: Secondary | ICD-10-CM | POA: Diagnosis not present

## 2023-05-09 DIAGNOSIS — M1990 Unspecified osteoarthritis, unspecified site: Secondary | ICD-10-CM | POA: Diagnosis not present

## 2023-05-09 DIAGNOSIS — M25512 Pain in left shoulder: Secondary | ICD-10-CM | POA: Diagnosis not present

## 2023-05-09 DIAGNOSIS — M0609 Rheumatoid arthritis without rheumatoid factor, multiple sites: Secondary | ICD-10-CM | POA: Diagnosis not present

## 2023-05-09 DIAGNOSIS — M75100 Unspecified rotator cuff tear or rupture of unspecified shoulder, not specified as traumatic: Secondary | ICD-10-CM | POA: Diagnosis not present

## 2023-05-09 DIAGNOSIS — G8929 Other chronic pain: Secondary | ICD-10-CM | POA: Diagnosis not present

## 2023-05-09 DIAGNOSIS — Z79899 Other long term (current) drug therapy: Secondary | ICD-10-CM | POA: Diagnosis not present

## 2023-05-21 DIAGNOSIS — M0609 Rheumatoid arthritis without rheumatoid factor, multiple sites: Secondary | ICD-10-CM | POA: Diagnosis not present

## 2023-06-18 DIAGNOSIS — M0609 Rheumatoid arthritis without rheumatoid factor, multiple sites: Secondary | ICD-10-CM | POA: Diagnosis not present

## 2023-06-28 DIAGNOSIS — M0609 Rheumatoid arthritis without rheumatoid factor, multiple sites: Secondary | ICD-10-CM | POA: Diagnosis not present

## 2023-06-28 DIAGNOSIS — D869 Sarcoidosis, unspecified: Secondary | ICD-10-CM | POA: Diagnosis not present

## 2023-06-28 DIAGNOSIS — E041 Nontoxic single thyroid nodule: Secondary | ICD-10-CM | POA: Diagnosis not present

## 2023-06-28 DIAGNOSIS — M329 Systemic lupus erythematosus, unspecified: Secondary | ICD-10-CM | POA: Diagnosis not present

## 2023-06-28 DIAGNOSIS — D84821 Immunodeficiency due to drugs: Secondary | ICD-10-CM | POA: Diagnosis not present

## 2023-07-11 DIAGNOSIS — Z79899 Other long term (current) drug therapy: Secondary | ICD-10-CM | POA: Diagnosis not present

## 2023-07-11 DIAGNOSIS — E7800 Pure hypercholesterolemia, unspecified: Secondary | ICD-10-CM | POA: Diagnosis not present

## 2023-07-16 DIAGNOSIS — M7502 Adhesive capsulitis of left shoulder: Secondary | ICD-10-CM | POA: Diagnosis not present

## 2023-07-16 DIAGNOSIS — M25512 Pain in left shoulder: Secondary | ICD-10-CM | POA: Diagnosis not present

## 2023-07-16 DIAGNOSIS — M0609 Rheumatoid arthritis without rheumatoid factor, multiple sites: Secondary | ICD-10-CM | POA: Diagnosis not present

## 2023-07-17 DIAGNOSIS — E7800 Pure hypercholesterolemia, unspecified: Secondary | ICD-10-CM | POA: Diagnosis not present

## 2023-07-17 DIAGNOSIS — Z9181 History of falling: Secondary | ICD-10-CM | POA: Diagnosis not present

## 2023-07-17 DIAGNOSIS — R059 Cough, unspecified: Secondary | ICD-10-CM | POA: Diagnosis not present

## 2023-07-17 DIAGNOSIS — Z6821 Body mass index (BMI) 21.0-21.9, adult: Secondary | ICD-10-CM | POA: Diagnosis not present

## 2023-07-17 DIAGNOSIS — F430 Acute stress reaction: Secondary | ICD-10-CM | POA: Diagnosis not present

## 2023-07-17 DIAGNOSIS — Z0000 Encounter for general adult medical examination without abnormal findings: Secondary | ICD-10-CM | POA: Diagnosis not present

## 2023-08-05 DIAGNOSIS — M25512 Pain in left shoulder: Secondary | ICD-10-CM | POA: Diagnosis not present

## 2023-08-07 ENCOUNTER — Ambulatory Visit
Admission: RE | Admit: 2023-08-07 | Discharge: 2023-08-07 | Disposition: A | Discharge status: Home / Self Care | Payer: Medicare HMO | Source: Ambulatory Visit | Attending: Family Medicine | Admitting: Family Medicine

## 2023-08-07 ENCOUNTER — Other Ambulatory Visit: Payer: Self-pay | Admitting: Family Medicine

## 2023-08-07 DIAGNOSIS — R053 Chronic cough: Secondary | ICD-10-CM

## 2023-08-07 DIAGNOSIS — R059 Cough, unspecified: Secondary | ICD-10-CM | POA: Diagnosis not present

## 2023-08-07 DIAGNOSIS — R079 Chest pain, unspecified: Secondary | ICD-10-CM | POA: Diagnosis not present

## 2023-08-13 DIAGNOSIS — M0609 Rheumatoid arthritis without rheumatoid factor, multiple sites: Secondary | ICD-10-CM | POA: Diagnosis not present

## 2023-08-22 DIAGNOSIS — M7502 Adhesive capsulitis of left shoulder: Secondary | ICD-10-CM | POA: Diagnosis not present

## 2023-08-27 DIAGNOSIS — Z9689 Presence of other specified functional implants: Secondary | ICD-10-CM | POA: Diagnosis not present

## 2023-08-27 DIAGNOSIS — M0579 Rheumatoid arthritis with rheumatoid factor of multiple sites without organ or systems involvement: Secondary | ICD-10-CM | POA: Diagnosis not present

## 2023-08-27 DIAGNOSIS — F1120 Opioid dependence, uncomplicated: Secondary | ICD-10-CM | POA: Diagnosis not present

## 2023-08-27 DIAGNOSIS — G894 Chronic pain syndrome: Secondary | ICD-10-CM | POA: Diagnosis not present

## 2023-09-03 ENCOUNTER — Other Ambulatory Visit: Payer: Self-pay | Admitting: Neurology

## 2023-09-17 DIAGNOSIS — R059 Cough, unspecified: Secondary | ICD-10-CM | POA: Diagnosis not present

## 2023-09-17 DIAGNOSIS — M75100 Unspecified rotator cuff tear or rupture of unspecified shoulder, not specified as traumatic: Secondary | ICD-10-CM | POA: Diagnosis not present

## 2023-09-17 DIAGNOSIS — M0609 Rheumatoid arthritis without rheumatoid factor, multiple sites: Secondary | ICD-10-CM | POA: Diagnosis not present

## 2023-09-17 DIAGNOSIS — M25512 Pain in left shoulder: Secondary | ICD-10-CM | POA: Diagnosis not present

## 2023-09-17 DIAGNOSIS — M2550 Pain in unspecified joint: Secondary | ICD-10-CM | POA: Diagnosis not present

## 2023-09-17 DIAGNOSIS — G8929 Other chronic pain: Secondary | ICD-10-CM | POA: Diagnosis not present

## 2023-09-17 DIAGNOSIS — R768 Other specified abnormal immunological findings in serum: Secondary | ICD-10-CM | POA: Diagnosis not present

## 2023-09-17 DIAGNOSIS — Z79899 Other long term (current) drug therapy: Secondary | ICD-10-CM | POA: Diagnosis not present

## 2023-09-17 DIAGNOSIS — M1990 Unspecified osteoarthritis, unspecified site: Secondary | ICD-10-CM | POA: Diagnosis not present

## 2023-10-01 DIAGNOSIS — E041 Nontoxic single thyroid nodule: Secondary | ICD-10-CM | POA: Diagnosis not present

## 2023-10-01 DIAGNOSIS — D869 Sarcoidosis, unspecified: Secondary | ICD-10-CM | POA: Diagnosis not present

## 2023-10-01 DIAGNOSIS — E559 Vitamin D deficiency, unspecified: Secondary | ICD-10-CM | POA: Diagnosis not present

## 2023-10-01 DIAGNOSIS — M329 Systemic lupus erythematosus, unspecified: Secondary | ICD-10-CM | POA: Diagnosis not present

## 2023-10-15 DIAGNOSIS — R059 Cough, unspecified: Secondary | ICD-10-CM | POA: Diagnosis not present

## 2023-10-15 DIAGNOSIS — M0609 Rheumatoid arthritis without rheumatoid factor, multiple sites: Secondary | ICD-10-CM | POA: Diagnosis not present

## 2023-11-14 DIAGNOSIS — M0609 Rheumatoid arthritis without rheumatoid factor, multiple sites: Secondary | ICD-10-CM | POA: Diagnosis not present

## 2024-01-01 DIAGNOSIS — M5412 Radiculopathy, cervical region: Secondary | ICD-10-CM | POA: Diagnosis not present

## 2024-01-03 ENCOUNTER — Other Ambulatory Visit: Payer: Self-pay | Admitting: Neurosurgery

## 2024-01-03 DIAGNOSIS — M5412 Radiculopathy, cervical region: Secondary | ICD-10-CM

## 2024-01-15 NOTE — Discharge Instructions (Signed)

## 2024-01-16 ENCOUNTER — Other Ambulatory Visit: Payer: Self-pay | Admitting: Neurology

## 2024-01-16 ENCOUNTER — Ambulatory Visit
Admission: RE | Admit: 2024-01-16 | Discharge: 2024-01-16 | Disposition: A | Discharge status: Home / Self Care | Payer: HMO | Source: Ambulatory Visit | Attending: Neurosurgery | Admitting: Neurosurgery

## 2024-01-16 ENCOUNTER — Ambulatory Visit
Admission: RE | Admit: 2024-01-16 | Discharge: 2024-01-16 | Disposition: A | Discharge status: Home / Self Care | Payer: Medicare HMO | Source: Ambulatory Visit | Attending: Neurosurgery | Admitting: Neurosurgery

## 2024-01-16 DIAGNOSIS — M5412 Radiculopathy, cervical region: Secondary | ICD-10-CM

## 2024-01-16 MED ORDER — DIAZEPAM 5 MG PO TABS: 10.0000 mg | ORAL_TABLET | Freq: Once | ORAL | Status: DC

## 2024-01-16 MED ORDER — IOPAMIDOL (ISOVUE-M 300) INJECTION 61%
10.0000 mL | Freq: Once | INTRAMUSCULAR | Status: AC
  Administered 2024-01-16: 10 mL via INTRATHECAL

## 2024-01-16 MED ORDER — ONDANSETRON HCL 4 MG/2ML IJ SOLN: 4.0000 mg | Freq: Once | INTRAMUSCULAR | Status: DC | PRN

## 2024-01-16 NOTE — Progress Notes (Signed)
 Post meylo

## 2024-01-16 NOTE — Progress Notes (Signed)
Pt has spinal cord stimulator and reports she has turned it off for her CT myelogram procedure.  

## 2024-01-21 ENCOUNTER — Institutional Professional Consult (permissible substitution): Payer: Medicare HMO | Admitting: Internal Medicine

## 2024-01-23 DIAGNOSIS — M0609 Rheumatoid arthritis without rheumatoid factor, multiple sites: Secondary | ICD-10-CM | POA: Diagnosis not present

## 2024-01-28 DIAGNOSIS — M549 Dorsalgia, unspecified: Secondary | ICD-10-CM | POA: Diagnosis not present

## 2024-01-28 DIAGNOSIS — Z79899 Other long term (current) drug therapy: Secondary | ICD-10-CM | POA: Diagnosis not present

## 2024-01-28 DIAGNOSIS — M75100 Unspecified rotator cuff tear or rupture of unspecified shoulder, not specified as traumatic: Secondary | ICD-10-CM | POA: Diagnosis not present

## 2024-01-28 DIAGNOSIS — G8929 Other chronic pain: Secondary | ICD-10-CM | POA: Diagnosis not present

## 2024-01-28 DIAGNOSIS — M2550 Pain in unspecified joint: Secondary | ICD-10-CM | POA: Diagnosis not present

## 2024-01-28 DIAGNOSIS — M25512 Pain in left shoulder: Secondary | ICD-10-CM | POA: Diagnosis not present

## 2024-01-28 DIAGNOSIS — M1990 Unspecified osteoarthritis, unspecified site: Secondary | ICD-10-CM | POA: Diagnosis not present

## 2024-01-28 DIAGNOSIS — M0609 Rheumatoid arthritis without rheumatoid factor, multiple sites: Secondary | ICD-10-CM | POA: Diagnosis not present

## 2024-01-28 DIAGNOSIS — R768 Other specified abnormal immunological findings in serum: Secondary | ICD-10-CM | POA: Diagnosis not present

## 2024-01-28 DIAGNOSIS — M542 Cervicalgia: Secondary | ICD-10-CM | POA: Diagnosis not present

## 2024-01-29 DIAGNOSIS — E559 Vitamin D deficiency, unspecified: Secondary | ICD-10-CM | POA: Diagnosis not present

## 2024-01-29 DIAGNOSIS — M329 Systemic lupus erythematosus, unspecified: Secondary | ICD-10-CM | POA: Diagnosis not present

## 2024-01-29 DIAGNOSIS — M0609 Rheumatoid arthritis without rheumatoid factor, multiple sites: Secondary | ICD-10-CM | POA: Diagnosis not present

## 2024-01-29 DIAGNOSIS — M8580 Other specified disorders of bone density and structure, unspecified site: Secondary | ICD-10-CM | POA: Diagnosis not present

## 2024-01-29 DIAGNOSIS — E041 Nontoxic single thyroid nodule: Secondary | ICD-10-CM | POA: Diagnosis not present

## 2024-01-29 DIAGNOSIS — D869 Sarcoidosis, unspecified: Secondary | ICD-10-CM | POA: Diagnosis not present

## 2024-02-11 DIAGNOSIS — M545 Low back pain, unspecified: Secondary | ICD-10-CM | POA: Diagnosis not present

## 2024-02-11 DIAGNOSIS — M5412 Radiculopathy, cervical region: Secondary | ICD-10-CM | POA: Diagnosis not present

## 2024-02-19 ENCOUNTER — Ambulatory Visit: Payer: Medicare HMO | Admitting: Neurology

## 2024-02-19 ENCOUNTER — Encounter: Payer: Self-pay | Admitting: Neurology

## 2024-02-19 VITALS — BP 108/58 | HR 80 | Ht 69.0 in | Wt 138.6 lb

## 2024-02-19 DIAGNOSIS — R519 Headache, unspecified: Secondary | ICD-10-CM | POA: Diagnosis not present

## 2024-02-19 DIAGNOSIS — G40909 Epilepsy, unspecified, not intractable, without status epilepticus: Secondary | ICD-10-CM

## 2024-02-19 DIAGNOSIS — M5412 Radiculopathy, cervical region: Secondary | ICD-10-CM | POA: Diagnosis not present

## 2024-02-19 DIAGNOSIS — M545 Low back pain, unspecified: Secondary | ICD-10-CM | POA: Diagnosis not present

## 2024-02-19 DIAGNOSIS — R251 Tremor, unspecified: Secondary | ICD-10-CM | POA: Diagnosis not present

## 2024-02-19 DIAGNOSIS — F419 Anxiety disorder, unspecified: Secondary | ICD-10-CM

## 2024-02-19 DIAGNOSIS — F32A Depression, unspecified: Secondary | ICD-10-CM

## 2024-02-19 MED ORDER — TOPIRAMATE 25 MG PO TABS
ORAL_TABLET | ORAL | 3 refills | Status: AC
Start: 2024-02-19 — End: ?

## 2024-02-19 MED ORDER — CLONAZEPAM 1 MG PO TABS: ORAL_TABLET | ORAL | 5 refills | Status: DC

## 2024-02-19 NOTE — Patient Instructions (Signed)
 Good to see you. Wishing you all the best!  Referral will be sent to Behavioral health for counseling  2. Continue all your medications  3. Let me know if/when you would like to proceed with Speech/Cognitive therapy  4. Continue Physical therapy  5. Follow-up in 1 year, call for any changes.    Seizure Precautions: 1. If medication has been prescribed for you to prevent seizures, take it exactly as directed.  Do not stop taking the medicine without talking to your doctor first, even if you have not had a seizure in a long time.   2. Avoid activities in which a seizure would cause danger to yourself or to others.  Don't operate dangerous machinery, swim alone, or climb in high or dangerous places, such as on ladders, roofs, or girders.  Do not drive unless your doctor says you may.  3. If you have any warning that you may have a seizure, lay down in a safe place where you can't hurt yourself.    4.  No driving for 6 months from last seizure, as per [facility].   Please refer to the following link on the [employer]'s website for more information: http://www.epilepsyfoundation.org/answerplace/Social/driving/drivingu.cfm   5.  Maintain good sleep hygiene.  6.  Contact your doctor if you have any problems that may be related to the medicine you are taking.  7.  Call 911 and bring the patient back to the ED if:        A.  The seizure lasts longer than 5 minutes.       B.  The patient doesn't awaken shortly after the seizure  C.  The patient has new problems such as difficulty seeing, speaking or moving  D.  The patient was injured during the seizure  E.  The patient has a temperature over 102 F (39C)  F.  The patient vomited and now is having trouble breathing
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
## 2024-02-20 DIAGNOSIS — M0609 Rheumatoid arthritis without rheumatoid factor, multiple sites: Secondary | ICD-10-CM | POA: Diagnosis not present

## 2024-02-26 DIAGNOSIS — M545 Low back pain, unspecified: Secondary | ICD-10-CM | POA: Diagnosis not present

## 2024-02-26 DIAGNOSIS — M5412 Radiculopathy, cervical region: Secondary | ICD-10-CM | POA: Diagnosis not present

## 2024-02-26 DIAGNOSIS — F1120 Opioid dependence, uncomplicated: Secondary | ICD-10-CM | POA: Diagnosis not present

## 2024-02-26 DIAGNOSIS — Z9689 Presence of other specified functional implants: Secondary | ICD-10-CM | POA: Diagnosis not present

## 2024-02-26 DIAGNOSIS — G894 Chronic pain syndrome: Secondary | ICD-10-CM | POA: Diagnosis not present

## 2024-03-03 DIAGNOSIS — M545 Low back pain, unspecified: Secondary | ICD-10-CM | POA: Diagnosis not present

## 2024-03-03 DIAGNOSIS — M5412 Radiculopathy, cervical region: Secondary | ICD-10-CM | POA: Diagnosis not present

## 2024-03-04 DIAGNOSIS — M5412 Radiculopathy, cervical region: Secondary | ICD-10-CM | POA: Diagnosis not present

## 2024-03-05 DIAGNOSIS — M5412 Radiculopathy, cervical region: Secondary | ICD-10-CM | POA: Diagnosis not present

## 2024-03-05 DIAGNOSIS — M545 Low back pain, unspecified: Secondary | ICD-10-CM | POA: Diagnosis not present

## 2024-03-19 DIAGNOSIS — M0609 Rheumatoid arthritis without rheumatoid factor, multiple sites: Secondary | ICD-10-CM | POA: Diagnosis not present

## 2024-04-02 ENCOUNTER — Ambulatory Visit (INDEPENDENT_AMBULATORY_CARE_PROVIDER_SITE_OTHER): Payer: Self-pay | Admitting: Licensed Clinical Social Worker

## 2024-04-02 DIAGNOSIS — Z91199 Patient's noncompliance with other medical treatment and regimen due to unspecified reason: Secondary | ICD-10-CM

## 2024-04-02 NOTE — Progress Notes (Signed)
 THERAPIST PROGRESS NOTE   Session Date: 04/02/2024  Session Time: 0800  Participation Level: Did Not Attend  Patient no-showed today's appointment; appointment was for intake [facility] to establish care.   Patsi Boots, MSW, LCSW 04/02/2024,  8:18 AM

## 2024-04-29 ENCOUNTER — Ambulatory Visit (HOSPITAL_COMMUNITY): Payer: Self-pay | Admitting: Licensed Clinical Social Worker

## 2024-04-29 DIAGNOSIS — F331 Major depressive disorder, recurrent, moderate: Secondary | ICD-10-CM | POA: Diagnosis not present

## 2024-04-29 DIAGNOSIS — F4310 Post-traumatic stress disorder, unspecified: Secondary | ICD-10-CM | POA: Diagnosis not present

## 2024-04-29 DIAGNOSIS — F411 Generalized anxiety disorder: Secondary | ICD-10-CM
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
## 2024-05-14 ENCOUNTER — Ambulatory Visit (HOSPITAL_COMMUNITY): Admitting: Licensed Clinical Social Worker

## 2024-05-14 ENCOUNTER — Encounter (HOSPITAL_COMMUNITY): Payer: Self-pay

## 2024-05-14 DIAGNOSIS — F411 Generalized anxiety disorder: Secondary | ICD-10-CM | POA: Diagnosis not present

## 2024-05-14 DIAGNOSIS — M75100 Unspecified rotator cuff tear or rupture of unspecified shoulder, not specified as traumatic: Secondary | ICD-10-CM | POA: Diagnosis not present

## 2024-05-14 DIAGNOSIS — F331 Major depressive disorder, recurrent, moderate: Secondary | ICD-10-CM | POA: Diagnosis not present

## 2024-05-14 DIAGNOSIS — M0609 Rheumatoid arthritis without rheumatoid factor, multiple sites: Secondary | ICD-10-CM | POA: Diagnosis not present

## 2024-05-14 DIAGNOSIS — M2550 Pain in unspecified joint: Secondary | ICD-10-CM | POA: Diagnosis not present

## 2024-05-14 DIAGNOSIS — M1990 Unspecified osteoarthritis, unspecified site: Secondary | ICD-10-CM | POA: Diagnosis not present

## 2024-05-14 DIAGNOSIS — Z79899 Other long term (current) drug therapy: Secondary | ICD-10-CM | POA: Diagnosis not present

## 2024-05-14 DIAGNOSIS — M549 Dorsalgia, unspecified: Secondary | ICD-10-CM | POA: Diagnosis not present

## 2024-05-14 DIAGNOSIS — R768 Other specified abnormal immunological findings in serum: Secondary | ICD-10-CM | POA: Diagnosis not present

## 2024-05-14 DIAGNOSIS — F4310 Post-traumatic stress disorder, unspecified: Secondary | ICD-10-CM

## 2024-05-14 DIAGNOSIS — M25512 Pain in left shoulder: Secondary | ICD-10-CM | POA: Diagnosis not present

## 2024-05-14 DIAGNOSIS — M542 Cervicalgia: Secondary | ICD-10-CM | POA: Diagnosis not present

## 2024-05-14 DIAGNOSIS — G8929 Other chronic pain: Secondary | ICD-10-CM | POA: Diagnosis not present
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
## 2024-05-27 ENCOUNTER — Ambulatory Visit (HOSPITAL_COMMUNITY): Admitting: Licensed Clinical Social Worker

## 2024-05-27 DIAGNOSIS — F411 Generalized anxiety disorder: Secondary | ICD-10-CM

## 2024-05-27 DIAGNOSIS — F4310 Post-traumatic stress disorder, unspecified: Secondary | ICD-10-CM

## 2024-05-27 DIAGNOSIS — F331 Major depressive disorder, recurrent, moderate: Secondary | ICD-10-CM
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
## 2024-06-03 DIAGNOSIS — M5412 Radiculopathy, cervical region: Secondary | ICD-10-CM | POA: Diagnosis not present

## 2024-06-18 ENCOUNTER — Ambulatory Visit (HOSPITAL_COMMUNITY): Admitting: Licensed Clinical Social Worker

## 2024-06-18 DIAGNOSIS — F331 Major depressive disorder, recurrent, moderate: Secondary | ICD-10-CM

## 2024-06-18 DIAGNOSIS — F4310 Post-traumatic stress disorder, unspecified: Secondary | ICD-10-CM | POA: Diagnosis not present

## 2024-06-18 DIAGNOSIS — F411 Generalized anxiety disorder: Secondary | ICD-10-CM | POA: Diagnosis not present

## 2024-06-18 NOTE — Progress Notes (Signed)
 THERAPIST PROGRESS NOTE   Session Date: 06/18/2024  Session Time: 1505 - 1558  Participation Level: Active  MSE/Presentation: Behavior: Appropriate and Sharing Speech: Normal Thought Process: Coherent and Relevant Cognition: Alert and Appropriate Mood: Anxious, Euthymic, and Irritable Affect: Congruent Insight: Lacking Appearance: Casual  Type of Therapy: Individual Therapy  Treatment Goals addressed:   Initial (6) STG: Report a decrease in anxiety symptoms as evidenced by an overall reduction in anxiety score by a minimum of 25% on the Generalized Anxiety Disorder Scale (GAD-7) (Anxiety) STG: Nikkita will reduce frequency of avoidant behaviors by 50% as evidenced by self-report in therapy sessions (OP Depression) LTG: I want to be able to feel that my anxiety is coming, be able to take control and improve my management of it (Anxiety) STG: Reduce overall depression score by a minimum of 25% on the Patient Health Questionnaire (PHQ-9) (OP Depression) STG: Renalda will identify cognitive patterns and beliefs that support depression (OP Depression)  Progress Towards Goals: Progressing  Interventions: CBT, Solution Focused, Strength-based, and Supportive  Summary: Nyleah Cunliffe is a 53 y.o. female with psych history of GAD, MDD, and PTSD presenting for follow-up therapy session in efforts to improve management of anxious and depressive symptoms.   Patient openly engaged in session, presenting in overall pleasant moods, exhibiting periods of increased irritability when exploring recent events, and congruent affect throughout duration of visit. Patient actively engaged in introductory check-in, sharing of being agitated and pissed, Mom's at it again, further detailing of mother's efforts to encourage pt to purchase condo from her by assuming the mortgage and mother moving into assisted living. Pt further detailed having connected with father via phone earlier today regarding stress  surrounding mother's pressuring pt to purchase condo, and discussion proving to reinforce pt's irritation with mother due to father's awareness of mother's nature. Pt actively processed hx of challenging interactions with mother, reviewing timeline of events occurring following pt's separation from last husband, and the challenges that pt experienced in interactions with mother. Processed lack of boundaries within relationship with mother and pt's challenges historically at attempts to implement and adhere to healthy boundaries.  Patient responded well to interventions. Patient continues to meet criteria for GAD, MDD, and PTSD. Patient will continue to benefit from engagement in outpatient therapy due to being the least restrictive service to meet presenting needs.      05/27/2024    3:10 PM 05/14/2024    3:40 PM 04/29/2024    8:26 AM  GAD 7 : Generalized Anxiety Score  Nervous, Anxious, on Edge 1 1 1   Control/stop worrying 0 1 1  Worry too much - different things 0 0 1  Trouble relaxing 1 1 1   Restless 0 0 1  Easily annoyed or irritable 1 3 3   Afraid - awful might happen 0 0 3  Total GAD 7 Score 3 6 11   Anxiety Difficulty Not difficult at all Somewhat difficult Very difficult      05/27/2024    3:24 PM 05/14/2024    3:43 PM 04/29/2024    8:19 AM  Depression screen PHQ 2/9  Decreased Interest 1 1 2   Down, Depressed, Hopeless 1 1 3   PHQ - 2 Score 2 2 5   Altered sleeping 3 3 3   Tired, decreased energy 3 3 3   Change in appetite 0 3 1  Feeling bad or failure about yourself  0 0 0  Trouble concentrating 0 0 2  Moving slowly or fidgety/restless 0 1 2  Suicidal thoughts 0  0 0  PHQ-9 Score 8 12 16   Difficult doing work/chores Somewhat difficult Somewhat difficult Very difficult   Suicidal/Homicidal: None, No plan to harm self or others  Therapist Response: Clinician utilized CBT, MI, Solution focused, and supportive reflection interventions to support patient in efforts to address presenting  sxs and challenges surrounding presenting stressors.  Clinician actively greeted pt upon presenting for visit, assessing presenting moods and affect. Openly engaged pt in introductory check-in, prompting recounts of how pt has been doing during the past two weeks since previous visit, eliciting details related to presenting moods, and recent challenges and/or stressors. Actively listened to the pt's recounts of events of the past two weeks, utilizing open ended questions to further support pt in processing recent challenges and/or stressors in relation to relationship with mother. Utilized socratic questions to support pt in greater critical processing of thoughts and perspectives held surrounding relationship with mother currently and over recent years, the relationship with brother during childhood and up to the point of his passing, and pt's feelings in relation to parents avoidance of conversations surrounding brother.  Clinician reassessed severity of presenting sxs, and presence of any safety concerns. Clinician provided support and empathy to patient during session.  Homework: [facility] in efforts to explore establishing care for continued med man, and begin actively increasing efforts at implementing healthy boundaries within relationship with mother.  Plan: Return again in 3 weeks.  Diagnosis:  Encounter Diagnoses  Name Primary?   GAD (generalized anxiety disorder) Yes   MDD ([place] depressive disorder), recurrent episode, moderate (HCC)    PTSD (post-traumatic stress disorder)       Collaboration of Care: Other none necessary at this time.  Patient/Guardian was advised Release of Information must be obtained prior to any record release in order to collaborate their care with an outside provider. Patient/Guardian was advised if they have not already done so to contact the registration department to sign all necessary forms in order for us  to release  information regarding their care.   Consent: Patient/Guardian gives verbal consent for treatment and assignment of benefits for services provided during this visit. Patient/Guardian expressed understanding and agreed to proceed.   Lynwood JONETTA Maris, MSW, LCSW 06/18/2024,  3:04 PM

## 2024-07-02 ENCOUNTER — Ambulatory Visit (INDEPENDENT_AMBULATORY_CARE_PROVIDER_SITE_OTHER): Admitting: Licensed Clinical Social Worker

## 2024-07-02 DIAGNOSIS — F411 Generalized anxiety disorder: Secondary | ICD-10-CM | POA: Diagnosis not present

## 2024-07-02 DIAGNOSIS — F4310 Post-traumatic stress disorder, unspecified: Secondary | ICD-10-CM

## 2024-07-02 DIAGNOSIS — F331 Major depressive disorder, recurrent, moderate: Secondary | ICD-10-CM

## 2024-07-02 NOTE — Progress Notes (Signed)
 THERAPIST PROGRESS NOTE   Session Date: 07/02/2024  Session Time: 1310 - 1403  Participation Level: Active  MSE/Presentation: Behavior: Appropriate and Sharing Speech: Normal Thought Process: Coherent, Relevant, and Tangential Cognition: Alert and Appropriate Mood: Anxious, Euthymic, and Irritable Affect: Congruent Insight: Lacking Appearance: Casual  Type of Therapy: Individual Therapy  Treatment Goals addressed:  Initial (6) STG: Report a decrease in anxiety symptoms as evidenced by an overall reduction in anxiety score by a minimum of 25% on the Generalized Anxiety Disorder Scale (GAD-7) (Anxiety) STG: Robin Paul will reduce frequency of avoidant behaviors by 50% as evidenced by self-report in therapy sessions (OP Depression) LTG: I want to be able to feel that my anxiety is coming, be able to take control and improve my management of it (Anxiety) STG: Reduce overall depression score by a minimum of 25% on the Patient Health Questionnaire (PHQ-9) (OP Depression) STG: Robin Paul will identify cognitive patterns and beliefs that support depression (OP Depression)  Progress Towards Goals: Progressing  Interventions: CBT, Solution Focused, Strength-based, and Supportive  Summary: Robin Paul is a 53 y.o. female with psych history of GAD, MDD, and PTSD presenting for follow-up therapy session in efforts to improve management of anxious and depressive symptoms.   Patient openly engaged in session, presenting in overall pleasant moods, appearing to be doing well overall, sharing of I'm here, proving to engage in reassessing of depressive and anxious sxs via PHQ-9 and GAD-7, noting of minimal increase in depressive sxs, attributing feeling down as a result of interactions with mother, further sharing of recent interactions that proved to be challenging for pt, specifically mother's persistence in traveling to multiple stores/locations when pt went shopping with her recently, leading to  pt overexerting physical capabilities and resulting in increased fatigue the following days. Pt further detailed recent stress surrounding having learned of step-sister having decided to relocate to [place], from [place], resulting in pt feeling her father will be alone without family local in [place], sharing of having spent time over recent days considering relocating to be closer to father. Pt continued to relay stress surrounding relationships with parents and feeling torn between both and guilt experienced when spending time with father due to the manipulative comments and overall nature of mother.  Patient responded well to interventions. Patient continues to meet criteria for GAD, MDD, and PTSD. Patient will continue to benefit from engagement in outpatient therapy due to being the least restrictive service to meet presenting needs.      07/02/2024    1:31 PM 05/27/2024    3:10 PM 05/14/2024    3:40 PM 04/29/2024    8:26 AM  GAD 7 : Generalized Anxiety Score  Nervous, Anxious, on Edge 1 1 1 1   Control/stop worrying 0 0 1 1  Worry too much - different things 0 0 0 1  Trouble relaxing 1 1 1 1   Restless 0 0 0 1  Easily annoyed or irritable 1 1 3 3   Afraid - awful might happen 0 0 0 3  Total GAD 7 Score 3 3 6 11   Anxiety Difficulty Not difficult at all Not difficult at all Somewhat difficult Very difficult      07/02/2024    1:13 PM 05/27/2024    3:24 PM 05/14/2024    3:43 PM 04/29/2024    8:19 AM  Depression screen PHQ 2/9  Decreased Interest 0 1 1 2   Down, Depressed, Hopeless 1 1 1 3   PHQ - 2 Score 1 2 2 5   Altered sleeping  2 3 3 3   Tired, decreased energy 2 3 3 3   Change in appetite 1 0 3 1  Feeling bad or failure about yourself  1 0 0 0  Trouble concentrating 1 0 0 2  Moving slowly or fidgety/restless 0 0 1 2  Suicidal thoughts 1 0 0 0  PHQ-9 Score 9 8 12 16   Difficult doing work/chores Not difficult at all Somewhat difficult Somewhat difficult Very difficult    Suicidal/Homicidal: None, No plan to harm self or others  Therapist Response: Clinician utilized CBT, MI, Solution focused, and supportive reflection interventions to support patient in efforts to address presenting sxs and challenges surrounding presenting stressors.  Clinician actively greeted pt upon presenting for visit, assessing presenting moods and affect. Openly engaged pt in introductory check-in, observing pt's increased eagerness to share recent events and challenges. Openly engaged pt in reassessing presenting depressive and anxious sxs via PHQ-9 and GAD-7, noting of minimal increase in depressive sxs, utilizing open ended questions to prompt pt's recounts of events, and factors contributing to presenting moods and increased stress. Actively listened to pt's reflections of events, providing support and validation of identified feelings. Challenging irrational thoughts and aiding pt in exploring what she has control over. Explored pt's understanding and awareness of boundaries and the importance in familial relationships.  Clinician reassessed severity of presenting sxs, and presence of any safety concerns. Clinician provided support and empathy to patient during session.  Homework: Actively work on individual efforts at increasing of appropriate boundaries within relationships to improve management of challenging feelings surrounding interactions with mother.  Plan: Return again in 2 weeks.  Diagnosis:  Encounter Diagnoses  Name Primary?   GAD (generalized anxiety disorder) Yes   MDD ([place] depressive disorder), recurrent episode, moderate (HCC)    PTSD (post-traumatic stress disorder)      Collaboration of Care: Other none necessary at this time.  Patient/Guardian was advised Release of Information must be obtained prior to any record release in order to collaborate their care with an outside provider. Patient/Guardian was advised if they have not already done so to  contact the registration department to sign all necessary forms in order for us  to release information regarding their care.   Consent: Patient/Guardian gives verbal consent for treatment and assignment of benefits for services provided during this visit. Patient/Guardian expressed understanding and agreed to proceed.   Lynwood JONETTA Maris, MSW, LCSW 07/02/2024,  1:47 PM

## 2024-07-09 DIAGNOSIS — M0609 Rheumatoid arthritis without rheumatoid factor, multiple sites: Secondary | ICD-10-CM | POA: Diagnosis not present

## 2024-07-16 ENCOUNTER — Ambulatory Visit (INDEPENDENT_AMBULATORY_CARE_PROVIDER_SITE_OTHER): Admitting: Licensed Clinical Social Worker

## 2024-07-16 DIAGNOSIS — F331 Major depressive disorder, recurrent, moderate: Secondary | ICD-10-CM | POA: Diagnosis not present

## 2024-07-16 DIAGNOSIS — F411 Generalized anxiety disorder: Secondary | ICD-10-CM | POA: Diagnosis not present

## 2024-07-16 DIAGNOSIS — E7800 Pure hypercholesterolemia, unspecified: Secondary | ICD-10-CM | POA: Diagnosis not present

## 2024-07-16 DIAGNOSIS — F22 Delusional disorders: Secondary | ICD-10-CM | POA: Diagnosis not present

## 2024-07-16 DIAGNOSIS — F4310 Post-traumatic stress disorder, unspecified: Secondary | ICD-10-CM

## 2024-07-16 NOTE — Progress Notes (Unsigned)
 THERAPIST PROGRESS NOTE   Session Date: 07/16/2024  Session Time: 1305 - 1404  Participation Level: Active  MSE/Presentation: Behavior: Appropriate and Sharing Speech: Normal Thought Process: Coherent, Relevant, Circumstantial, and Tangential Cognition: Alert, Appropriate, and paranoia Mood: Anxious and Irritable Affect: Congruent Insight: Lacking Appearance: Casual  Type of Therapy: Individual Therapy  Treatment Goals addressed:  Initial (6) STG: Report a decrease in anxiety symptoms as evidenced by an overall reduction in anxiety score by a minimum of 25% on the Generalized Anxiety Disorder Scale (GAD-7) (Anxiety) STG: Robin Paul will reduce frequency of avoidant behaviors by 50% as evidenced by self-report in therapy sessions (OP Depression) LTG: I want to be able to feel that my anxiety is coming, be able to take control and improve my management of it (Anxiety) STG: Reduce overall depression score by a minimum of 25% on the Patient Health Questionnaire (PHQ-9) (OP Depression) STG: Robin Paul will identify cognitive patterns and beliefs that support depression (OP Depression)  Progress Towards Goals: Progressing  Interventions: CBT, Solution Focused, Strength-based, and Supportive  Summary: Robin Paul is a 53 y.o. female with psych history of GAD, MDD, and PTSD presenting for follow-up therapy session in efforts to improve management of anxious and depressive symptoms.   Patient openly engaged in session, presenting in overall pleasant moods, appearing to be doing well overall, sharing of It's been a hell of a two weeks, further detailing of It happened immediately after I left here last time, sharing further of having returned home and found that someone had been in her home and moved financial documents and medications around, believing this to be her mother and having confronted mother about issue. Pt shared of having addressed concerns with mother surrounding pt being on  mother's phone plan, leading to mother canceling pt's line and pt later having secured an alternate phone and new number in her own name. Pt detailed challenges regarding [facility], having left practice phone number in home and believing someone, identifying her mother, having been inside her home and having gotten office details, proving to have contacted practice in order to sabotage pt, thus resulting in opting to connect with [facility] to schedule initial appt. Actively engaged in processing pt's individual inconsistency with medications proving to vary dosage between 20-40mg  Prozac daily, processing the risks to such actions, and the importance of connecting with psychiatric provider in efforts to best support medication adjustments considering hx of having taken Prozac for 20+ years and proving ineffective. Explored potential medication interactions that can too be exacerbating pt's anxiousness and increased levels of paranoia, proving to contribute to dysregulation. Pt expressed thoughts considering pursuing INPT tx to address medication needs/managed med adjustments, processing thoughts and perspectives surrounding benefits this would serve.   Patient responded well to interventions. Patient continues to meet criteria for GAD, MDD, and PTSD. Patient will continue to benefit from engagement in outpatient therapy due to being the least restrictive service to meet presenting needs.      07/02/2024    1:31 PM 05/27/2024    3:10 PM 05/14/2024    3:40 PM 04/29/2024    8:26 AM  GAD 7 : Generalized Anxiety Score  Nervous, Anxious, on Edge 1 1 1 1   Control/stop worrying 0 0 1 1  Worry too much - different things 0 0 0 1  Trouble relaxing 1 1 1 1   Restless 0 0 0 1  Easily annoyed or irritable 1 1 3 3   Afraid - awful might happen 0 0  0 3  Total GAD 7 Score 3 3 6 11   Anxiety Difficulty Not difficult at all Not difficult at all Somewhat difficult Very  difficult      07/02/2024    1:13 PM 05/27/2024    3:24 PM 05/14/2024    3:43 PM 04/29/2024    8:19 AM  Depression screen PHQ 2/9  Decreased Interest 0 1 1 2   Down, Depressed, Hopeless 1 1 1 3   PHQ - 2 Score 1 2 2 5   Altered sleeping 2 3 3 3   Tired, decreased energy 2 3 3 3   Change in appetite 1 0 3 1  Feeling bad or failure about yourself  1 0 0 0  Trouble concentrating 1 0 0 2  Moving slowly or fidgety/restless 0 0 1 2  Suicidal thoughts 1 0 0 0  PHQ-9 Score 9 8 12 16   Difficult doing work/chores Not difficult at all Somewhat difficult Somewhat difficult Very difficult   Suicidal/Homicidal: None, No plan to harm self or others  Therapist Response: Clinician utilized CBT, MI, Solution focused, and supportive reflection interventions to support patient in efforts to address presenting sxs and challenges surrounding presenting stressors.  Clinician Openly greeted pt upon presenting for visit, assessing presenting moods and affect. Actively engaged pt in introductory check-in, observing pt's increased anxiousness and distress, utilizing open ended questions in order to elicit pt's recounts of challenges and/or stressors of the past two weeks, and implications on moods. Utilized actively listening techniques to support pt's reflections of events and processing of thoughts and feelings experienced surrounding stress. Provided support for pt in processing feelings, challenging irrational thoughts, and aiding pt in exploring contributing factors to increased anxiousness and paranoia. Utilized psychoed, CBT, and MI in aiding pt to process factors related to medication inconsistencies, mood dysregulation, and paranoia. Provided support to pt in processing benefits of pursuing INPT admission to aid in medication stabilization.  Clinician reassessed severity of presenting sxs, and presence of any safety concerns. Clinician provided support and empathy to patient during  session.  Homework: Actively work on individual efforts at increasing of appropriate boundaries within relationships to improve management of challenging feelings surrounding interactions with mother.  Plan: Return again in 2 weeks.  Diagnosis:  Encounter Diagnoses  Name Primary?   Paranoia (HCC) Yes   GAD (generalized anxiety disorder)    MDD ([place] depressive disorder), recurrent episode, moderate (HCC)    PTSD (post-traumatic stress disorder)       Collaboration of Care: Other none necessary at this time.  Patient/Guardian was advised Release of Information must be obtained prior to any record release in order to collaborate their care with an outside provider. Patient/Guardian was advised if they have not already done so to contact the registration department to sign all necessary forms in order for us  to release information regarding their care.   Consent: Patient/Guardian gives verbal consent for treatment and assignment of benefits for services provided during this visit. Patient/Guardian expressed understanding and agreed to proceed.   Lynwood JONETTA Maris, MSW, LCSW 07/16/2024,  1:11 PM

## 2024-07-23 DIAGNOSIS — Z0000 Encounter for general adult medical examination without abnormal findings: Secondary | ICD-10-CM | POA: Diagnosis not present

## 2024-07-23 DIAGNOSIS — Z6821 Body mass index (BMI) 21.0-21.9, adult: Secondary | ICD-10-CM | POA: Diagnosis not present

## 2024-07-23 DIAGNOSIS — F430 Acute stress reaction: Secondary | ICD-10-CM | POA: Diagnosis not present

## 2024-07-30 ENCOUNTER — Ambulatory Visit (HOSPITAL_COMMUNITY): Admitting: Licensed Clinical Social Worker

## 2024-07-30 DIAGNOSIS — F331 Major depressive disorder, recurrent, moderate: Secondary | ICD-10-CM

## 2024-07-30 DIAGNOSIS — D869 Sarcoidosis, unspecified: Secondary | ICD-10-CM | POA: Diagnosis not present

## 2024-07-30 DIAGNOSIS — F411 Generalized anxiety disorder: Secondary | ICD-10-CM

## 2024-07-30 DIAGNOSIS — E7800 Pure hypercholesterolemia, unspecified: Secondary | ICD-10-CM | POA: Diagnosis not present

## 2024-07-30 DIAGNOSIS — M8580 Other specified disorders of bone density and structure, unspecified site: Secondary | ICD-10-CM | POA: Diagnosis not present

## 2024-07-30 DIAGNOSIS — F4310 Post-traumatic stress disorder, unspecified: Secondary | ICD-10-CM

## 2024-07-30 DIAGNOSIS — E041 Nontoxic single thyroid nodule: Secondary | ICD-10-CM | POA: Diagnosis not present

## 2024-07-30 DIAGNOSIS — M329 Systemic lupus erythematosus, unspecified: Secondary | ICD-10-CM | POA: Diagnosis not present

## 2024-07-30 DIAGNOSIS — M0609 Rheumatoid arthritis without rheumatoid factor, multiple sites: Secondary | ICD-10-CM | POA: Diagnosis not present

## 2024-07-30 NOTE — Progress Notes (Unsigned)
 THERAPIST PROGRESS NOTE   Session Date: 07/30/2024  Session Time: 1012 - 1111  Participation Level: Active  MSE/Presentation: Behavior: Appropriate and Sharing Speech: Normal Thought Process: Coherent and Relevant Cognition: Alert and Appropriate Mood: Anxious Affect: Congruent Insight: Improving Appearance: Casual  Type of Therapy: Individual Therapy  Treatment Goals addressed:  Initial (6) STG: Report a decrease in anxiety symptoms as evidenced by an overall reduction in anxiety score by a minimum of 25% on the Generalized Anxiety Disorder Scale (GAD-7) (Anxiety) STG: Robin Paul will reduce frequency of avoidant behaviors by 50% as evidenced by self-report in therapy sessions (OP Depression) LTG: I want to be able to feel that my anxiety is coming, be able to take control and improve my management of it (Anxiety) STG: Reduce overall depression score by a minimum of 25% on the Patient Health Questionnaire (PHQ-9) (OP Depression) STG: Robin Paul will identify cognitive patterns and beliefs that support depression (OP Depression)  Progress Towards Goals: Progressing  Interventions: CBT, Solution Focused, Strength-based, and Supportive  Summary: Robin Paul is a 53 y.o. female with psych history of GAD, MDD, and PTSD presenting for follow-up therapy session in efforts to improve management of anxious and depressive symptoms.   Patient openly engaged in session, presenting in overall pleasant moods, appearing to be doing well overall, sharing of Probably the best you'll see me, cause it's morning, further sharing of I have to tell you, 40mg  of prozac drives me crazy, detailing of having noticed self ***      It's been a hell of a two weeks, further detailing of It happened immediately after I left here last time, sharing further of having returned home and found that someone had been in her home and moved financial documents and medications around, believing this to be her  mother and having confronted mother about issue. Pt shared of having addressed concerns with mother surrounding pt being on mother's phone plan, leading to mother canceling pt's line and pt later having secured an alternate phone and new number in her own name. Pt detailed challenges [facility], having left practice phone number in home and believing someone, identifying her mother, having been inside her home and having gotten office details, proving to have contacted practice in order to sabotage pt, thus resulting in opting to connect with [facility] to schedule initial appt. Actively engaged in processing pt's individual inconsistency with medications proving to vary dosage between 20-40mg  Prozac daily, processing the risks to such actions, and the importance of connecting with psychiatric provider in efforts to best support medication adjustments considering hx of having taken Prozac for 20+ years and proving ineffective. Explored potential medication interactions that can too be exacerbating pt's anxiousness and increased levels of paranoia, proving to contribute to dysregulation. Pt expressed thoughts considering pursuing INPT tx to address medication needs/managed med adjustments, processing thoughts and perspectives surrounding benefits this would serve.   Patient responded well to interventions. Patient continues to meet criteria for GAD, MDD, and PTSD. Patient will continue to benefit from engagement in outpatient therapy due to being the least restrictive service to meet presenting needs.      07/30/2024   11:01 AM 07/02/2024    1:31 PM 05/27/2024    3:10 PM 05/14/2024    3:40 PM  GAD 7 : Generalized Anxiety Score  Nervous, Anxious, on Edge 2 1 1 1   Control/stop worrying 2 0 0 1  Worry too much - different things 2 0 0 0  Trouble  relaxing 2 1 1 1   Restless 1 0 0 0  Easily annoyed or irritable 1 1 1 3   Afraid - awful might happen 0 0 0 0   Total GAD 7 Score 10 3 3 6   Anxiety Difficulty Somewhat difficult Not difficult at all Not difficult at all Somewhat difficult      07/30/2024   11:03 AM 07/02/2024    1:13 PM 05/27/2024    3:24 PM 05/14/2024    3:43 PM 04/29/2024    8:19 AM  Depression screen PHQ 2/9  Decreased Interest 1 0 1 1 2   Down, Depressed, Hopeless 1 1 1 1 3   PHQ - 2 Score 2 1 2 2 5   Altered sleeping 1 2 3 3 3   Tired, decreased energy 1 2 3 3 3   Change in appetite 2 1 0 3 1  Feeling bad or failure about yourself  0 1 0 0 0  Trouble concentrating 1 1 0 0 2  Moving slowly or fidgety/restless 0 0 0 1 2  Suicidal thoughts 1 1 0 0 0  PHQ-9 Score 8 9 8 12 16   Difficult doing work/chores Somewhat difficult Not difficult at all Somewhat difficult Somewhat difficult Very difficult   Suicidal/Homicidal: None, No plan to harm self or others  Therapist Response: Clinician utilized CBT, MI, Solution focused, and supportive reflection interventions to support patient in efforts to address presenting sxs and challenges surrounding presenting stressors.  Clinician Openly greeted pt upon presenting for visit, assessing presenting moods and affect. *** Actively engaged pt in introductory check-in, observing pt's increased anxiousness and distress, utilizing open ended questions in order to elicit pt's recounts of challenges and/or stressors of the past two weeks, and implications on moods. Utilized actively listening techniques to support pt's reflections of events and processing of thoughts and feelings experienced surrounding stress. Provided support for pt in processing feelings, challenging irrational thoughts, and aiding pt in exploring contributing factors to increased anxiousness and paranoia. Utilized psychoed, CBT, and MI in aiding pt to process factors related to medication inconsistencies, mood dysregulation, and paranoia. Provided support to pt in processing benefits of pursuing INPT admission to aid in medication  stabilization.  Clinician reassessed severity of presenting sxs, and presence of any safety concerns. Clinician provided support and empathy to patient during session.  Homework: Actively work on individual efforts at increasing of appropriate boundaries within relationships to improve management of challenging feelings surrounding interactions with mother.  Plan: Return again in 2 weeks.  Diagnosis:  Encounter Diagnoses  Name Primary?   GAD (generalized anxiety disorder) Yes   MDD ([place] depressive disorder), recurrent episode, moderate (HCC)    PTSD (post-traumatic stress disorder)        Collaboration of Care: Other none necessary at this time.  Patient/Guardian was advised Release of Information must be obtained prior to any record release in order to collaborate their care with an outside provider. Patient/Guardian was advised if they have not already done so to contact the registration department to sign all necessary forms in order for us  to release information regarding their care.   Consent: Patient/Guardian gives verbal consent for treatment and assignment of benefits for services provided during this visit. Patient/Guardian expressed understanding and agreed to proceed.   Lynwood JONETTA Maris, MSW, LCSW 07/30/2024,  11:07 AM

## 2024-08-03 ENCOUNTER — Other Ambulatory Visit: Payer: Self-pay | Admitting: Family Medicine

## 2024-08-03 DIAGNOSIS — N6320 Unspecified lump in the left breast, unspecified quadrant: Secondary | ICD-10-CM

## 2024-08-04 DIAGNOSIS — E041 Nontoxic single thyroid nodule: Secondary | ICD-10-CM | POA: Diagnosis not present

## 2024-08-13 ENCOUNTER — Ambulatory Visit (HOSPITAL_COMMUNITY): Admitting: Licensed Clinical Social Worker

## 2024-08-13 DIAGNOSIS — F4310 Post-traumatic stress disorder, unspecified: Secondary | ICD-10-CM

## 2024-08-13 DIAGNOSIS — F331 Major depressive disorder, recurrent, moderate: Secondary | ICD-10-CM | POA: Diagnosis not present

## 2024-08-13 DIAGNOSIS — F411 Generalized anxiety disorder: Secondary | ICD-10-CM | POA: Diagnosis not present
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
## 2024-08-26 DIAGNOSIS — E041 Nontoxic single thyroid nodule: Secondary | ICD-10-CM | POA: Diagnosis not present

## 2024-08-27 ENCOUNTER — Ambulatory Visit (HOSPITAL_COMMUNITY): Admitting: Licensed Clinical Social Worker

## 2024-08-27 DIAGNOSIS — F331 Major depressive disorder, recurrent, moderate: Secondary | ICD-10-CM | POA: Diagnosis not present

## 2024-08-27 DIAGNOSIS — F411 Generalized anxiety disorder: Secondary | ICD-10-CM

## 2024-08-27 DIAGNOSIS — F4310 Post-traumatic stress disorder, unspecified: Secondary | ICD-10-CM | POA: Diagnosis not present
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
## 2024-09-01 DIAGNOSIS — R053 Chronic cough: Secondary | ICD-10-CM | POA: Diagnosis not present

## 2024-09-01 DIAGNOSIS — E041 Nontoxic single thyroid nodule: Secondary | ICD-10-CM | POA: Diagnosis not present

## 2024-09-02 DIAGNOSIS — H524 Presbyopia: Secondary | ICD-10-CM | POA: Diagnosis not present

## 2024-09-02 DIAGNOSIS — M3501 Sicca syndrome with keratoconjunctivitis: Secondary | ICD-10-CM | POA: Diagnosis not present

## 2024-09-02 DIAGNOSIS — H5203 Hypermetropia, bilateral: Secondary | ICD-10-CM | POA: Diagnosis not present

## 2024-09-02 DIAGNOSIS — H52223 Regular astigmatism, bilateral: Secondary | ICD-10-CM | POA: Diagnosis not present

## 2024-09-03 DIAGNOSIS — M0609 Rheumatoid arthritis without rheumatoid factor, multiple sites: Secondary | ICD-10-CM | POA: Diagnosis not present

## 2024-09-10 ENCOUNTER — Ambulatory Visit (HOSPITAL_COMMUNITY): Admitting: Licensed Clinical Social Worker

## 2024-09-10 DIAGNOSIS — F331 Major depressive disorder, recurrent, moderate: Secondary | ICD-10-CM | POA: Diagnosis not present

## 2024-09-10 DIAGNOSIS — F4310 Post-traumatic stress disorder, unspecified: Secondary | ICD-10-CM

## 2024-09-10 DIAGNOSIS — F411 Generalized anxiety disorder: Secondary | ICD-10-CM | POA: Diagnosis not present

## 2024-09-10 NOTE — Progress Notes (Signed)
 THERAPIST PROGRESS NOTE   Session Date: 09/10/2024  Session Time: 1010 - 1106  Participation Level: Active  MSE/Presentation: Behavior: Appropriate and Sharing Speech: Normal Thought Process: Coherent and Relevant Cognition: Alert and Appropriate Mood: Anxious and Euthymic Affect: Congruent Insight: Improving Appearance: Casual  Type of Therapy: Individual Therapy  Treatment Goals addressed:  Initial (6) STG: Report a decrease in anxiety symptoms as evidenced by an overall reduction in anxiety score by a minimum of 25% on the Generalized Anxiety Disorder Scale (GAD-7) (Anxiety) STG: Robin Paul will reduce frequency of avoidant behaviors by 50% as evidenced by self-report in therapy sessions (OP Depression) LTG: I want to be able to feel that my anxiety is coming, be able to take control and improve my management of it (Anxiety) STG: Reduce overall depression score by a minimum of 25% on the Patient Health Questionnaire (PHQ-9) (OP Depression) STG: Robin Paul will identify cognitive patterns and beliefs that support depression (OP Depression)  Progress Towards Goals: Progressing  Interventions: CBT, Solution Focused, Strength-based, and Supportive  Summary: Robin Paul is a 53 y.o. female with psych history of GAD, MDD, and PTSD presenting for follow-up therapy session in efforts to improve management of anxious and depressive symptoms.   Patient openly engaged in session, presenting in pleasant moods, sharing of, and appearing to be doing well, further detailing of having ran multiple errands earlier today, and further sharing of I went through hell last week, further detailing of having been deep cleaning house last week and falling and injuring back. Pt further detailed stress surrounding recent ENT appt and them having not communicated with radiology to receive/obtain ultrasound, finding self becoming increasingly stressed, overwhelmed, and unsupported by medical community. Pt  further detailed challenges in relation to finding purpose, processing distorted perspectives and beliefs of her relationship with parents being better with pt living closer and caring for them, further exploring factors that pt finds to be important and processing her purpose, and the implications of past trauma pt is proving to observe in people-pleasing relationships.     07/30/2024   11:01 AM 07/02/2024    1:31 PM 05/27/2024    3:10 PM 05/14/2024    3:40 PM  GAD 7 : Generalized Anxiety Score  Nervous, Anxious, on Edge 2 1 1 1   Control/stop worrying 2 0 0 1  Worry too much - different things 2 0 0 0  Trouble relaxing 2 1 1 1   Restless 1 0 0 0  Easily annoyed or irritable 1 1 1 3   Afraid - awful might happen 0 0 0 0  Total GAD 7 Score 10 3 3 6   Anxiety Difficulty Somewhat difficult Not difficult at all Not difficult at all Somewhat difficult      07/30/2024   11:03 AM 07/02/2024    1:13 PM 05/27/2024    3:24 PM 05/14/2024    3:43 PM 04/29/2024    8:19 AM  Depression screen PHQ 2/9  Decreased Interest 1 0 1 1 2   Down, Depressed, Hopeless 1 1 1 1 3   PHQ - 2 Score 2 1 2 2 5   Altered sleeping 1 2 3 3 3   Tired, decreased energy 1 2 3 3 3   Change in appetite 2 1 0 3 1  Feeling bad or failure about yourself  0 1 0 0 0  Trouble concentrating 1 1 0 0 2  Moving slowly or fidgety/restless 0 0 0 1 2  Suicidal thoughts 1 1 0 0 0  PHQ-9 Score 8 9 8  12 16  Difficult doing work/chores Somewhat difficult Not difficult at all Somewhat difficult Somewhat difficult Very difficult   Suicidal/Homicidal: None, No plan to harm self or others  Therapist Response:  Clinician Openly greeted pt upon presenting for visit, assessing presenting moods and affect, engaging pt in introductory check-in, exploring daily events and presenting moods.  Utilized open ended questions in eliciting recounts of events of recent weeks, newly presenting and/or experienced stressors, recurring challenges, observed implications  on moods, and individual efforts at managing stressors.  Utilized active listening techniques to support pt's recounts of events, providing support and validation of identified thoughts and feelings, and perspectives surrounding stress.  Utilized psychoed, CBT, and MI in aiding pt to process stress and challenges. Clinician reassessed severity of presenting sxs, and presence of any safety concerns.  [x]  Cognitive Challenging [x]  Cognitive Refocusing [x]  Cognitive Reframing [x]  Communication Skills []  Compliance Issues []  DBT []  Exploration of Coping Patterns [x]  Exploration of Emotions [x]  Exploration of Relationship Patterns []  Guided Imagery []  Interactive Feedback [x]  Interpersonal Resolutions []  Mindfulness Training []  Preventative Services [x]  Psycho-Education []  Relaxation/Deep Breathing []  Review of Treatment Plan/Progress []  Role-Play/Behavioral Rehearsal [x]  Structured Problem Solving [x]  Supportive Reflection [x]  Symptom Management []  Other  Patient responded well to interventions. Patient continues to meet criteria for GAD, MDD, and PTSD. Patient will continue to benefit from engagement in outpatient therapy due to being the least restrictive service to meet presenting needs. Pt continues to maintain minimal progressions towards identified goals.  Homework: None.  Plan: Return again in 2 weeks.  Diagnosis:  Encounter Diagnoses  Name Primary?   GAD (generalized anxiety disorder) Yes   MDD ([place] depressive disorder), recurrent episode, moderate (HCC)    PTSD (post-traumatic stress disorder)     Collaboration of Care: Other none necessary at this time.  Patient/Guardian was advised Release of Information must be obtained prior to any record release in order to collaborate their care with an outside provider. Patient/Guardian was advised if they have not already done so to contact the registration department to sign all necessary forms in order for us  to release information  regarding their care.   Consent: Patient/Guardian gives verbal consent for treatment and assignment of benefits for services provided during this visit. Patient/Guardian expressed understanding and agreed to proceed.   Lynwood JONETTA Maris, MSW, LCSW 09/10/2024,  10:11 AM

## 2024-09-14 ENCOUNTER — Other Ambulatory Visit: Payer: Self-pay | Admitting: Neurology

## 2024-09-14 DIAGNOSIS — G40909 Epilepsy, unspecified, not intractable, without status epilepticus: Secondary | ICD-10-CM

## 2024-09-14 DIAGNOSIS — R251 Tremor, unspecified: Secondary | ICD-10-CM

## 2024-09-24 ENCOUNTER — Ambulatory Visit (HOSPITAL_COMMUNITY): Admitting: Licensed Clinical Social Worker

## 2024-09-24 DIAGNOSIS — F411 Generalized anxiety disorder: Secondary | ICD-10-CM | POA: Diagnosis not present

## 2024-09-24 DIAGNOSIS — F331 Major depressive disorder, recurrent, moderate: Secondary | ICD-10-CM

## 2024-09-24 DIAGNOSIS — F4310 Post-traumatic stress disorder, unspecified: Secondary | ICD-10-CM
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
## 2024-09-28 ENCOUNTER — Encounter (HOSPITAL_COMMUNITY): Payer: Self-pay | Admitting: Student in an Organized Health Care Education/Training Program

## 2024-09-28 ENCOUNTER — Ambulatory Visit (HOSPITAL_COMMUNITY): Admitting: Student in an Organized Health Care Education/Training Program

## 2024-09-28 VITALS — BP 115/68 | HR 71 | Ht 68.0 in | Wt 146.0 lb

## 2024-09-28 DIAGNOSIS — F411 Generalized anxiety disorder: Secondary | ICD-10-CM | POA: Diagnosis not present

## 2024-09-28 DIAGNOSIS — Z79899 Other long term (current) drug therapy: Secondary | ICD-10-CM | POA: Diagnosis not present

## 2024-09-28 DIAGNOSIS — F22 Delusional disorders: Secondary | ICD-10-CM

## 2024-09-28 DIAGNOSIS — F333 Major depressive disorder, recurrent, severe with psychotic symptoms: Secondary | ICD-10-CM | POA: Diagnosis not present
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
## 2024-09-29 ENCOUNTER — Ambulatory Visit (HOSPITAL_BASED_OUTPATIENT_CLINIC_OR_DEPARTMENT_OTHER)

## 2024-09-29 DIAGNOSIS — F333 Major depressive disorder, recurrent, severe with psychotic symptoms: Secondary | ICD-10-CM

## 2024-09-29 DIAGNOSIS — Z79899 Other long term (current) drug therapy: Secondary | ICD-10-CM | POA: Diagnosis not present

## 2024-09-29 NOTE — Progress Notes (Signed)
 Patient arrived today for her fasting labs. I drew 2 tiger tops from the left arm using a 22G straight needle. Patient tolerated well and without complaint

## 2024-10-01 LAB — COMPREHENSIVE METABOLIC PANEL WITH GFR
ALT: 19 IU/L (ref 0–32)
AST: 23 IU/L (ref 0–40)
Albumin: 4.9 g/dL (ref 3.8–4.9)
Alkaline Phosphatase: 111 IU/L (ref 49–135)
BUN/Creatinine Ratio: 14 (ref 9–23)
BUN: 15 mg/dL (ref 6–24)
Bilirubin Total: 0.6 mg/dL (ref 0.0–1.2)
CO2: 20 mmol/L (ref 20–29)
Calcium: 9.8 mg/dL (ref 8.7–10.2)
Chloride: 107 mmol/L — ABNORMAL HIGH (ref 96–106)
Creatinine, Ser: 1.1 mg/dL — ABNORMAL HIGH (ref 0.57–1.00)
Globulin, Total: 2.5 g/dL (ref 1.5–4.5)
Glucose: 48 mg/dL — ABNORMAL LOW (ref 70–99)
Potassium: 4.6 mmol/L (ref 3.5–5.2)
Sodium: 145 mmol/L — ABNORMAL HIGH (ref 134–144)
Total Protein: 7.4 g/dL (ref 6.0–8.5)
eGFR: 60 mL/min/1.73 (ref >59)

## 2024-10-01 LAB — CBC WITH DIFFERENTIAL/PLATELET
Basophils Absolute: 0.1 x10E3/uL (ref 0.0–0.2)
Basos: 1 %
EOS (ABSOLUTE): 0.3 x10E3/uL (ref 0.0–0.4)
Eos: 3 %
Hematocrit: 42.8 % (ref 34.0–46.6)
Hemoglobin: 13.4 g/dL (ref 11.1–15.9)
Immature Grans (Abs): 0 x10E3/uL (ref 0.0–0.1)
Immature Granulocytes: 0 %
Lymphocytes Absolute: 2.6 x10E3/uL (ref 0.7–3.1)
Lymphs: 31 %
MCH: 31.9 pg (ref 26.6–33.0)
MCHC: 31.3 g/dL — ABNORMAL LOW (ref 31.5–35.7)
MCV: 102 fL — ABNORMAL HIGH (ref 79–97)
Monocytes Absolute: 0.7 x10E3/uL (ref 0.1–0.9)
Monocytes: 8 %
Neutrophils Absolute: 4.7 x10E3/uL (ref 1.4–7.0)
Neutrophils: 57 %
Platelets: 266 x10E3/uL (ref 150–450)
RBC: 4.2 x10E6/uL (ref 3.77–5.28)
RDW: 13.1 % (ref 11.7–15.4)
WBC: 8.3 x10E3/uL (ref 3.4–10.8)

## 2024-10-01 LAB — VITAMIN D 25 HYDROXY (VIT D DEFICIENCY, FRACTURES): Vit D, 25-Hydroxy: 40.2 ng/mL (ref 30.0–100.0)

## 2024-10-01 LAB — TSH+FREE T4
Free T4: 1.06 ng/dL (ref 0.82–1.77)
TSH: 0.886 u[IU]/mL (ref 0.450–4.500)

## 2024-10-01 LAB — RPR: RPR Ser Ql: NONREACTIVE

## 2024-10-01 LAB — B12 AND FOLATE PANEL
Folate: >20 ng/mL (ref >3.0)
Vitamin B-12: 924 pg/mL (ref 232–1245)

## 2024-10-01 LAB — HIV ANTIBODY (ROUTINE TESTING W REFLEX): HIV Screen 4th Generation wRfx: NONREACTIVE

## 2024-10-02 ENCOUNTER — Encounter: Payer: Self-pay | Admitting: Neurology

## 2024-10-08 ENCOUNTER — Ambulatory Visit (HOSPITAL_COMMUNITY): Admitting: Licensed Clinical Social Worker

## 2024-10-08 DIAGNOSIS — F411 Generalized anxiety disorder: Secondary | ICD-10-CM

## 2024-10-08 DIAGNOSIS — F333 Major depressive disorder, recurrent, severe with psychotic symptoms: Secondary | ICD-10-CM

## 2024-10-08 NOTE — Progress Notes (Signed)
 THERAPIST PROGRESS NOTE   Session Date: 10/08/2024  Session Time: 1116 - 1203  Participation Level: Active  MSE/Presentation: Behavior: Appropriate and Sharing Speech: Normal Thought Process: Coherent and Relevant Cognition: Alert and Appropriate Mood: Anxious, Depressed, and Euthymic Affect: Congruent Insight: Improving Appearance: Casual  Type of Therapy: Individual Therapy  Treatment Goals addressed:  Initial (6) STG: Report a decrease in anxiety symptoms as evidenced by an overall reduction in anxiety score by a minimum of 25% on the Generalized Anxiety Disorder Scale (GAD-7) (Anxiety) STG: Bennye will reduce frequency of avoidant behaviors by 50% as evidenced by self-report in therapy sessions (OP Depression) LTG: I want to be able to feel that my anxiety is coming, be able to take control and improve my management of it (Anxiety) STG: Reduce overall depression score by a minimum of 25% on the Patient Health Questionnaire (PHQ-9) (OP Depression) STG: Tanetta will identify cognitive patterns and beliefs that support depression (OP Depression)  Progress Towards Goals: Progressing  Interventions: CBT, Solution Focused, Strength-based, and Supportive  Summary: Robin Paul is a 53 y.o. female with psych history of MDD, GAD, and PTSD presenting for follow-up therapy session in efforts to improve management of anxious and depressive symptoms.   Patient openly engaged in session, presenting in depressed and anxious moods, sharing of having had recent initial med man visit with Carrion-Carrero, MD on 11/10, sharing of feeling appt to have gone well and adjusted Prozac to 60mg . Pt shared of having noticed significant increase in anxious sxs, in particular, irritability, sharing of having timed it one day, further sharing of having found self significantly more frustrated within 45-60min following morning dosage, opting to change to taking medication at night time, finding sxs to  have tapered since doing so two days ago. Pt further engaged in recounts of recent stressors, detailing challenging feelings surrounding father and interactions with father having reached out to pt in efforts to explore opportunity of spending time together over coming holidays, as well as increased anxiousness in relation to anticipated time spent with mother over Thanksgiving holiday next week.  Pt also noted of increased anxiousness surrounding beliefs of her phone having been hacked, proving to have lost contact details and the device behaving in a bizarre manner when clicking on certain applications or contacts, having attempted to address issue with [employer], however efforts proving unsuccessful, expressing plans to contact law enforcement if continued. Actively processed possible paranoia, further sharing details of prior events occurring within her home over the past 6-8 months leading pt to believe her devices and belongs having been tampered with.     10/08/2024   11:54 AM 09/28/2024    9:02 AM 07/30/2024   11:01 AM 07/02/2024    1:31 PM  GAD 7 : Generalized Anxiety Score  Nervous, Anxious, on Edge 2 2 2 1   Control/stop worrying 1 1 2  0  Worry too much - different things 1 1 2  0  Trouble relaxing 1 2 2 1   Restless 1 1 1  0  Easily annoyed or irritable 2 3 1 1   Afraid - awful might happen 0 1 0 0  Total GAD 7 Score 8 11 10 3   Anxiety Difficulty Somewhat difficult Very difficult Somewhat difficult Not difficult at all      10/08/2024   11:57 AM 09/28/2024    9:00 AM 07/30/2024   11:03 AM 07/02/2024    1:13 PM 05/27/2024    3:24 PM  Depression screen PHQ 2/9  Decreased Interest 1 2 1  0  1  Down, Depressed, Hopeless 1 2 1 1 1   PHQ - 2 Score 2 4 2 1 2   Altered sleeping 3 3 1 2 3   Tired, decreased energy 2 2 1 2 3   Change in appetite 1 2 2 1  0  Feeling bad or failure about yourself  0 3 0 1 0  Trouble concentrating 1 2 1 1  0  Moving slowly or fidgety/restless 2 2 0 0 0  Suicidal  thoughts 1 2 1 1  0  PHQ-9 Score 12 20 8  9  8    Difficult doing work/chores Somewhat difficult Very difficult Somewhat difficult Not difficult at all Somewhat difficult     Data saved with a previous flowsheet row definition   Suicidal/Homicidal: None, No plan to harm self or others  Therapist Response:  Clinician openly greeted pt upon presenting for visit, assessing presenting moods and affect, engaging pt in introductory check-in, exploring recent events and presenting moods.  Utilized open ended questions in eliciting recounts of events of recent weeks and further exploring recent med man visit, processing adjustments and observed outcomes. Engaged in exploring newly observed stressors in relation to beliefs of phone being tampered with, and ongoing challenges surrounding relationships and interactions with parents.  Utilized active listening techniques to support pt's recounts of events, providing support and validation of identified thoughts and feelings surrounding challenging relationships with parents, and further aiding pt in processing thoughts processing and feelings surrounding increased anxiousness.  Utilized psychoed, CBT, and solution focused interventions in aiding pt to process stress, challenges, and identifying potential actionable steps. Clinician reassessed severity of presenting sxs, and presence of any safety concerns.  [x]  Cognitive Challenging [x]  Cognitive Refocusing [x]  Cognitive Reframing [x]  Communication Skills []  Compliance Issues []  DBT []  Exploration of Coping Patterns [x]  Exploration of Emotions [x]  Exploration of Relationship Patterns []  Guided Imagery []  Interactive Feedback [x]  Interpersonal Resolutions []  Mindfulness Training []  Preventative Services [x]  Psycho-Education []  Relaxation/Deep Breathing []  Review of Treatment Plan/Progress []  Role-Play/Behavioral Rehearsal [x]  Structured Problem Solving [x]  Supportive Reflection [x]  Symptom Management []   Other  Patient responded well to interventions. Patient continues to meet criteria for GAD, MDD, and PTSD. Patient will continue to benefit from engagement in outpatient therapy due to being the least restrictive service to meet presenting needs. Pt continues to maintain minimal progressions towards identified goals.  Homework: None.  Plan: Return again in 2 weeks.  Diagnosis:  Encounter Diagnoses  Name Primary?   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  Patient/Guardian was advised Release of Information must be obtained prior to any record release in order to collaborate their care with an outside provider. Patient/Guardian was advised if they have not already done so to contact the registration department to sign all necessary forms in order for us  to release information regarding their care.   Consent: Patient/Guardian gives verbal consent for treatment and assignment of benefits for services provided during this visit. Patient/Guardian expressed understanding and agreed to proceed.   Lynwood JONETTA Maris, MSW, LCSW 10/08/2024,  12:01 PM

## 2024-10-12 ENCOUNTER — Encounter: Payer: Self-pay | Admitting: Neurology

## 2024-10-26 ENCOUNTER — Encounter (HOSPITAL_COMMUNITY): Payer: Self-pay | Admitting: Student in an Organized Health Care Education/Training Program

## 2024-10-26 ENCOUNTER — Ambulatory Visit (HOSPITAL_COMMUNITY): Admitting: Student in an Organized Health Care Education/Training Program

## 2024-10-26 VITALS — BP 97/61 | HR 84 | Ht 68.0 in | Wt 145.0 lb

## 2024-10-26 DIAGNOSIS — F333 Major depressive disorder, recurrent, severe with psychotic symptoms: Secondary | ICD-10-CM

## 2024-10-26 MED ORDER — ARIPIPRAZOLE 5 MG PO TABS: 5.0000 mg | ORAL_TABLET | Freq: Every day | ORAL | 2 refills | Status: AC
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
## 2024-10-29 ENCOUNTER — Ambulatory Visit (HOSPITAL_COMMUNITY): Admitting: Licensed Clinical Social Worker

## 2024-10-29 DIAGNOSIS — F4310 Post-traumatic stress disorder, unspecified: Secondary | ICD-10-CM

## 2024-10-29 DIAGNOSIS — F333 Major depressive disorder, recurrent, severe with psychotic symptoms: Secondary | ICD-10-CM | POA: Diagnosis not present

## 2024-10-29 DIAGNOSIS — F411 Generalized anxiety disorder: Secondary | ICD-10-CM | POA: Diagnosis not present
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
## 2024-11-05 ENCOUNTER — Ambulatory Visit (HOSPITAL_COMMUNITY): Admitting: Licensed Clinical Social Worker

## 2024-11-05 DIAGNOSIS — F333 Major depressive disorder, recurrent, severe with psychotic symptoms: Secondary | ICD-10-CM

## 2024-11-05 DIAGNOSIS — F411 Generalized anxiety disorder: Secondary | ICD-10-CM
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
## 2024-11-06 ENCOUNTER — Telehealth (HOSPITAL_COMMUNITY): Payer: Self-pay | Admitting: Student in an Organized Health Care Education/Training Program

## 2024-11-06 DIAGNOSIS — F411 Generalized anxiety disorder: Secondary | ICD-10-CM

## 2024-11-06 DIAGNOSIS — G2571 Drug induced akathisia: Secondary | ICD-10-CM

## 2024-11-06 MED ORDER — PROPRANOLOL HCL 10 MG PO TABS: 10.0000 mg | ORAL_TABLET | Freq: Three times a day (TID) | ORAL | 2 refills | Status: DC | PRN

## 2024-11-06 MED ORDER — LURASIDONE HCL 20 MG PO TABS: 20.0000 mg | ORAL_TABLET | Freq: Every day | ORAL | 0 refills | Status: DC

## 2024-11-06 NOTE — Telephone Encounter (Signed)
 Call 7:30 AM Contacted and spoke with patient, who reports increased loss of balance since starting Abilify  last week.  She reports she feels a sense of warmth in her body and increased restlessness at her legs.  She describes feeling like she cannot stay still when she is seated or resting.  She confirms that she took her Abilify  this morning.  I instructed patient to pick up prescription of propranolol 10 mg 3 times a day as needed for suspected akathisia.  Agreed to contact her later in the afternoon to assess for response.  Call 1:00 PM Was able to speak with the patient again at this time, she confirms she picked up propanolol and noted significant improvement in symptoms of restlessness since taking it.  Confirmed suspicion of akathisia, advised patient to stop Abilify  and discard medication.  Advised her to continue taking propranolol if she felt the symptoms reemerge.  Discussed transitioning to Latuda for mood stabilization.  Patient was also encouraged to use propranolol as a as needed for acute anxiety.  Plan to schedule check up via telephone in 1 week on 11/13/2024 to assess for response to medication.  Prescriptions sent during this encounter: - Latuda 20 mg daily with meals, 30 days, 0 refills - Propranolol 10 mg TID PRN, 90 days, 0 refills  Alayah Knouff Bobbye Etienne, MD PGY-3, [facility]

## 2024-11-13 ENCOUNTER — Telehealth: Payer: Self-pay | Admitting: Student in an Organized Health Care Education/Training Program

## 2024-11-13 DIAGNOSIS — G4700 Insomnia, unspecified: Secondary | ICD-10-CM

## 2024-11-13 MED ORDER — TRAZODONE HCL 50 MG PO TABS: 50.0000 mg | ORAL_TABLET | Freq: Every evening | ORAL | 2 refills | Status: DC | PRN

## 2024-11-13 NOTE — Telephone Encounter (Signed)
 Contacted and spoke with patient today.  She reports feeling better on Latuda  thus far. Her symptoms of restlessness have resolved. She does report having difficulty sleeping and was agreeable to starting trazodone  50 mg nightly.  Instructed patient to take half a tablet if she found the starting dose too sedating.  Kelven Flater Carrin Carrero, MD PGY-3, [facility]

## 2024-11-16 ENCOUNTER — Other Ambulatory Visit (HOSPITAL_COMMUNITY): Payer: Self-pay

## 2024-11-16 DIAGNOSIS — G4700 Insomnia, unspecified: Secondary | ICD-10-CM

## 2024-11-16 MED ORDER — TRAZODONE HCL 50 MG PO TABS: 50.0000 mg | ORAL_TABLET | Freq: Every evening | ORAL | 2 refills | Status: DC | PRN

## 2024-11-26 ENCOUNTER — Ambulatory Visit (HOSPITAL_COMMUNITY): Admitting: Licensed Clinical Social Worker

## 2024-11-26 DIAGNOSIS — F411 Generalized anxiety disorder: Secondary | ICD-10-CM | POA: Diagnosis not present

## 2024-11-26 DIAGNOSIS — F333 Major depressive disorder, recurrent, severe with psychotic symptoms: Secondary | ICD-10-CM | POA: Diagnosis not present
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
## 2024-11-29 ENCOUNTER — Other Ambulatory Visit (HOSPITAL_COMMUNITY): Payer: Self-pay | Admitting: Student in an Organized Health Care Education/Training Program

## 2024-11-29 DIAGNOSIS — F333 Major depressive disorder, recurrent, severe with psychotic symptoms: Secondary | ICD-10-CM

## 2024-11-29 DIAGNOSIS — F22 Delusional disorders: Secondary | ICD-10-CM
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
## 2024-11-30 ENCOUNTER — Ambulatory Visit (HOSPITAL_COMMUNITY): Admitting: Student in an Organized Health Care Education/Training Program

## 2024-11-30 DIAGNOSIS — G4700 Insomnia, unspecified: Secondary | ICD-10-CM | POA: Diagnosis not present

## 2024-11-30 DIAGNOSIS — F22 Delusional disorders: Secondary | ICD-10-CM | POA: Diagnosis not present

## 2024-11-30 DIAGNOSIS — F333 Major depressive disorder, recurrent, severe with psychotic symptoms: Secondary | ICD-10-CM

## 2024-11-30 DIAGNOSIS — F411 Generalized anxiety disorder: Secondary | ICD-10-CM | POA: Diagnosis not present

## 2024-11-30 MED ORDER — PROPRANOLOL HCL 10 MG PO TABS: 10.0000 mg | ORAL_TABLET | Freq: Three times a day (TID) | ORAL | 2 refills | Status: AC | PRN

## 2024-11-30 MED ORDER — TRAZODONE HCL 50 MG PO TABS: 50.0000 mg | ORAL_TABLET | Freq: Every evening | ORAL | 2 refills | Status: AC | PRN

## 2024-11-30 MED ORDER — LURASIDONE HCL 40 MG PO TABS: 40.0000 mg | ORAL_TABLET | Freq: Every day | ORAL | 0 refills | Status: AC

## 2024-12-10 ENCOUNTER — Ambulatory Visit (HOSPITAL_COMMUNITY): Admitting: Licensed Clinical Social Worker

## 2024-12-10 ENCOUNTER — Ambulatory Visit (INDEPENDENT_AMBULATORY_CARE_PROVIDER_SITE_OTHER): Admitting: Licensed Clinical Social Worker

## 2024-12-10 DIAGNOSIS — F333 Major depressive disorder, recurrent, severe with psychotic symptoms: Secondary | ICD-10-CM | POA: Diagnosis not present

## 2024-12-10 DIAGNOSIS — F4310 Post-traumatic stress disorder, unspecified: Secondary | ICD-10-CM

## 2024-12-10 DIAGNOSIS — F411 Generalized anxiety disorder: Secondary | ICD-10-CM

## 2024-12-10 DIAGNOSIS — F22 Delusional disorders: Secondary | ICD-10-CM
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
## 2024-12-24 ENCOUNTER — Ambulatory Visit (HOSPITAL_COMMUNITY): Admitting: Licensed Clinical Social Worker

## 2024-12-24 ENCOUNTER — Telehealth (HOSPITAL_COMMUNITY): Payer: Self-pay

## 2024-12-24 NOTE — Telephone Encounter (Signed)
 Patient called this morning, she was very tearful. She states that since going up on the Latuda  she feels worse than when she first came. She does not see you until 2/18. Please review and advise, thank you

## 2025-01-06 ENCOUNTER — Ambulatory Visit (HOSPITAL_COMMUNITY): Admitting: Student in an Organized Health Care Education/Training Program

## 2025-01-07 ENCOUNTER — Ambulatory Visit (HOSPITAL_COMMUNITY): Admitting: Licensed Clinical Social Worker

## 2025-01-21 ENCOUNTER — Ambulatory Visit (HOSPITAL_COMMUNITY): Admitting: Licensed Clinical Social Worker

## 2025-02-04 ENCOUNTER — Ambulatory Visit (HOSPITAL_COMMUNITY): Admitting: Licensed Clinical Social Worker

## 2025-02-11 ENCOUNTER — Ambulatory Visit: Admitting: Neurology

## 2025-02-18 ENCOUNTER — Ambulatory Visit: Admitting: Neurology
